# Patient Record
Sex: Female | Born: 1970 | Race: Black or African American | Hispanic: No | Marital: Single | Smoking: Current every day smoker
Health system: Southern US, Community
[De-identification: ages and names within clinical notes are randomized; demographics above are authoritative.]

## PROBLEM LIST (undated history)

## (undated) ENCOUNTER — Encounter

## (undated) ENCOUNTER — Ambulatory Visit: Payer: PRIVATE HEALTH INSURANCE | Attending: Registered" | Primary: Registered"

## (undated) ENCOUNTER — Ambulatory Visit: Payer: PRIVATE HEALTH INSURANCE

## (undated) ENCOUNTER — Ambulatory Visit

## (undated) ENCOUNTER — Encounter: Attending: Internal Medicine | Primary: Internal Medicine

## (undated) ENCOUNTER — Encounter: Attending: Dermatology | Primary: Dermatology

## (undated) ENCOUNTER — Ambulatory Visit: Payer: PRIVATE HEALTH INSURANCE | Attending: Internal Medicine | Primary: Internal Medicine

## (undated) ENCOUNTER — Telehealth

## (undated) ENCOUNTER — Encounter: Attending: "Endocrinology | Primary: "Endocrinology

## (undated) ENCOUNTER — Encounter: Attending: Registered" | Primary: Registered"

## (undated) ENCOUNTER — Ambulatory Visit
Payer: PRIVATE HEALTH INSURANCE | Attending: Rehabilitative and Restorative Service Providers" | Primary: Rehabilitative and Restorative Service Providers"

## (undated) ENCOUNTER — Telehealth: Attending: Internal Medicine | Primary: Internal Medicine

## (undated) ENCOUNTER — Telehealth: Attending: Family | Primary: Family

## (undated) ENCOUNTER — Ambulatory Visit
Payer: PRIVATE HEALTH INSURANCE | Attending: Student in an Organized Health Care Education/Training Program | Primary: Student in an Organized Health Care Education/Training Program

## (undated) ENCOUNTER — Encounter
Attending: Pharmacist Clinician (PhC)/ Clinical Pharmacy Specialist | Primary: Pharmacist Clinician (PhC)/ Clinical Pharmacy Specialist

## (undated) ENCOUNTER — Telehealth
Payer: PRIVATE HEALTH INSURANCE | Attending: Pharmacist Clinician (PhC)/ Clinical Pharmacy Specialist | Primary: Pharmacist Clinician (PhC)/ Clinical Pharmacy Specialist

## (undated) ENCOUNTER — Telehealth
Attending: Pharmacist Clinician (PhC)/ Clinical Pharmacy Specialist | Primary: Pharmacist Clinician (PhC)/ Clinical Pharmacy Specialist

## (undated) ENCOUNTER — Ambulatory Visit: Payer: PRIVATE HEALTH INSURANCE | Attending: Neurology | Primary: Neurology

## (undated) ENCOUNTER — Encounter: Attending: Gynecologic Oncology | Primary: Gynecologic Oncology

## (undated) ENCOUNTER — Ambulatory Visit: Payer: PRIVATE HEALTH INSURANCE | Attending: Family | Primary: Family

## (undated) ENCOUNTER — Ambulatory Visit: Payer: PRIVATE HEALTH INSURANCE | Attending: Gynecologic Oncology | Primary: Gynecologic Oncology

## (undated) ENCOUNTER — Encounter
Attending: Student in an Organized Health Care Education/Training Program | Primary: Student in an Organized Health Care Education/Training Program

## (undated) ENCOUNTER — Encounter: Attending: MOHS-Micrographic Surgery | Primary: MOHS-Micrographic Surgery

## (undated) ENCOUNTER — Telehealth: Attending: Gynecologic Oncology | Primary: Gynecologic Oncology

## (undated) ENCOUNTER — Ambulatory Visit: Attending: Obstetrics & Gynecology | Primary: Obstetrics & Gynecology

## (undated) ENCOUNTER — Encounter: Attending: Obesity Medicine | Primary: Obesity Medicine

## (undated) ENCOUNTER — Telehealth: Attending: "Endocrinology | Primary: "Endocrinology

## (undated) ENCOUNTER — Ambulatory Visit: Payer: PRIVATE HEALTH INSURANCE | Attending: Obesity Medicine | Primary: Obesity Medicine

## (undated) ENCOUNTER — Ambulatory Visit: Payer: PRIVATE HEALTH INSURANCE | Attending: "Endocrinology | Primary: "Endocrinology

## (undated) ENCOUNTER — Telehealth
Attending: Rehabilitative and Restorative Service Providers" | Primary: Rehabilitative and Restorative Service Providers"

## (undated) ENCOUNTER — Ambulatory Visit: Payer: PRIVATE HEALTH INSURANCE | Attending: "Women's Health Care | Primary: "Women's Health Care

## (undated) ENCOUNTER — Ambulatory Visit
Payer: PRIVATE HEALTH INSURANCE | Attending: Pharmacist Clinician (PhC)/ Clinical Pharmacy Specialist | Primary: Pharmacist Clinician (PhC)/ Clinical Pharmacy Specialist

## (undated) ENCOUNTER — Encounter: Attending: Obstetrics & Gynecology | Primary: Obstetrics & Gynecology

## (undated) ENCOUNTER — Telehealth: Attending: Obstetrics & Gynecology | Primary: Obstetrics & Gynecology

## (undated) ENCOUNTER — Ambulatory Visit: Attending: Pharmacist | Primary: Pharmacist

## (undated) DIAGNOSIS — E119 Type 2 diabetes mellitus without complications: Secondary | ICD-10-CM

## (undated) DIAGNOSIS — E7800 Pure hypercholesterolemia, unspecified: Secondary | ICD-10-CM

## (undated) DIAGNOSIS — I10 Essential (primary) hypertension: Secondary | ICD-10-CM

## (undated) HISTORY — PX: ABDOMINAL HYSTERECTOMY: SHX81

## (undated) HISTORY — PX: LIPOMA RESECTION: SHX23

---
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
## 2002-05-04 ENCOUNTER — Ambulatory Visit (HOSPITAL_COMMUNITY): Admission: RE | Admit: 2002-05-04 | Discharge: 2002-05-04 | Payer: Self-pay | Admitting: Obstetrics & Gynecology

## 2002-05-04 ENCOUNTER — Encounter: Payer: Self-pay | Admitting: Obstetrics & Gynecology

## 2002-05-05 ENCOUNTER — Other Ambulatory Visit: Admission: RE | Admit: 2002-05-05 | Discharge: 2002-05-05 | Payer: Self-pay | Admitting: Obstetrics & Gynecology

## 2005-06-18 ENCOUNTER — Emergency Department: Payer: Self-pay | Admitting: Emergency Medicine

## 2005-06-26 ENCOUNTER — Emergency Department: Payer: Self-pay | Admitting: Emergency Medicine

## 2013-10-28 ENCOUNTER — Inpatient Hospital Stay: Payer: Self-pay | Admitting: Internal Medicine

## 2013-10-28 LAB — COMPREHENSIVE METABOLIC PANEL
Albumin: 3.7 g/dL (ref 3.4–5.0)
Alkaline Phosphatase: 115 U/L
Anion Gap: 7 (ref 7–16)
BUN: 13 mg/dL (ref 7–18)
Bilirubin,Total: 0.3 mg/dL (ref 0.2–1.0)
Calcium, Total: 9.2 mg/dL (ref 8.5–10.1)
Chloride: 95 mmol/L — ABNORMAL LOW (ref 98–107)
Co2: 27 mmol/L (ref 21–32)
Creatinine: 1.07 mg/dL (ref 0.60–1.30)
EGFR (African American): >60
EGFR (Non-African Amer.): >60
Glucose: 375 mg/dL — ABNORMAL HIGH (ref 65–99)
Osmolality: 274 (ref 275–301)
Potassium: 3.6 mmol/L (ref 3.5–5.1)
SGOT(AST): 22 U/L (ref 15–37)
SGPT (ALT): 32 U/L (ref 12–78)
Sodium: 129 mmol/L — ABNORMAL LOW (ref 136–145)
Total Protein: 8.7 g/dL — ABNORMAL HIGH (ref 6.4–8.2)

## 2013-10-28 LAB — URINALYSIS, COMPLETE
Bacteria: NONE SEEN
Bilirubin,UR: NEGATIVE
Glucose,UR: >=500 mg/dL (ref 0–75)
Hyaline Cast: 42
Ketone: NEGATIVE
Leukocyte Esterase: NEGATIVE
Nitrite: NEGATIVE
Ph: 5 (ref 4.5–8.0)
Protein: 30
RBC,UR: 1 /HPF (ref 0–5)
Specific Gravity: 1.017 (ref 1.003–1.030)
Squamous Epithelial: 5
WBC UR: 4 /HPF (ref 0–5)

## 2013-10-28 LAB — CBC WITH DIFFERENTIAL/PLATELET
Basophil #: 0.1 10*3/uL (ref 0.0–0.1)
Basophil %: 1.4 %
Eosinophil #: 0.4 10*3/uL (ref 0.0–0.7)
Eosinophil %: 4.6 %
HCT: 48.9 % — ABNORMAL HIGH (ref 35.0–47.0)
HGB: 16.8 g/dL — ABNORMAL HIGH (ref 12.0–16.0)
Lymphocyte #: 2.7 10*3/uL (ref 1.0–3.6)
Lymphocyte %: 30.8 %
MCH: 29 pg (ref 26.0–34.0)
MCHC: 34.4 g/dL (ref 32.0–36.0)
MCV: 84 fL (ref 80–100)
Monocyte #: 0.7 x10 3/mm (ref 0.2–0.9)
Monocyte %: 8.2 %
Neutrophil #: 4.8 10*3/uL (ref 1.4–6.5)
Neutrophil %: 55 %
Platelet: 260 10*3/uL (ref 150–440)
RBC: 5.8 10*6/uL — ABNORMAL HIGH (ref 3.80–5.20)
RDW: 13.2 % (ref 11.5–14.5)
WBC: 8.7 10*3/uL (ref 3.6–11.0)

## 2013-10-28 LAB — LIPASE, BLOOD: Lipase: 616 U/L — ABNORMAL HIGH (ref 73–393)

## 2013-10-29 LAB — BASIC METABOLIC PANEL
Anion Gap: 8 (ref 7–16)
BUN: 11 mg/dL (ref 7–18)
Calcium, Total: 8 mg/dL — ABNORMAL LOW (ref 8.5–10.1)
Chloride: 100 mmol/L (ref 98–107)
Co2: 26 mmol/L (ref 21–32)
Creatinine: 0.94 mg/dL (ref 0.60–1.30)
EGFR (African American): >60
EGFR (Non-African Amer.): >60
Glucose: 276 mg/dL — ABNORMAL HIGH (ref 65–99)
Osmolality: 278 (ref 275–301)
Potassium: 3.7 mmol/L (ref 3.5–5.1)
Sodium: 134 mmol/L — ABNORMAL LOW (ref 136–145)

## 2013-10-29 LAB — LIPID PANEL
Cholesterol: 109 mg/dL (ref 0–200)
HDL Cholesterol: 29 mg/dL — ABNORMAL LOW (ref 40–60)
Ldl Cholesterol, Calc: 47 mg/dL (ref 0–100)
Triglycerides: 167 mg/dL (ref 0–200)
VLDL Cholesterol, Calc: 33 mg/dL (ref 5–40)

## 2013-10-29 LAB — LIPASE, BLOOD: Lipase: 382 U/L (ref 73–393)

## 2013-10-29 LAB — HEMOGLOBIN A1C: Hemoglobin A1C: 11.9 % — ABNORMAL HIGH (ref 4.2–6.3)

## 2015-02-24 NOTE — Discharge Summary (Signed)
PATIENT NAME:  Sherri Escobar, Sherri Escobar MR#:  [id number] DATE OF BIRTH:  1971/05/08  DATE OF ADMISSION:  10/28/2013 DATE OF DISCHARGE:  10/29/2013  PRIMARY CARE PHYSICIAN: Beverely RisenFozia Khan, MD  DISCHARGE DIAGNOSES: 1.  Acute pancreatitis. 2.  Hypertension. 3.  Diabetes. 4.  Morbid obesity. 5.  Tobacco abuse. 6.  Fatty liver.  CONDITION: Stable.   CODE STATUS: FULL.  HOME MEDICATIONS: Please refer to the [facility] physician discharge instruction medication reconciliation list. Continue home medications.   DIET: Low fat, low cholesterol, low sodium, ADA diet.  ACTIVITY: As tolerated.   FOLLOW-UP CARE: Follow with PCP within 1 to 2 weeks.   REASON FOR ADMISSION: Abdominal pain.   HOSPITAL COURSE:  1.  The patient is a 44 year old morbidly obese African American female with history of hypertension and diabetes who presented to the ED with epigastric pain and right upper quadrant pain radiating to the back associated with nausea. Workup in the ED with ultrasound did not show any cholelithiasis or cholecystitis. The patient's lipase was 600. The patient was admitted for acute pancreatitis. For detailed history and physical examination, please refer to the admission note dictated by Dr. Heron NayVasireddy. After admission the patient was kept Escobar.p.o. with IV fluid support and Zofran p.r.Escobar. The patient's symptoms have much improved, only has mild abdominal pain. No nausea or vomiting. The patient tolerated diet today. Lipase decreased to normal range at 382.  2.  Diabetes. The patient's diabetes is not controlled. Hemoglobin A1c is 11.9. The patient is on sliding scale. The last blood sugar was 276. The patient needs to adjust diabetes medication and follow up with PCP for sugar control.  3.  Hypertension. Has been controlled with hypertension medication.   The patient has no complaints. Vital signs are stable. She is clinically stable and will be discharged to home today. In addition, the patient has tobacco abuse.  The patient was counseled for smoking cessation. I discussed the patient's discharge plan with the patient, case manager, nurse, and the patient's mother.   TIME SPENT: About 35 minutes.  ____________________________ Shaune PollackQing Elen Acero, MD qc:sb D: 10/29/2013 12:37:56 ET T: 10/29/2013 13:09:09 ET JOB#: [id number]  cc: Shaune PollackQing Charnice Zwilling, MD, <Dictator> Shaune PollackQING Romie Tay MD ELECTRONICALLY SIGNED 10/29/2013 13:30

## 2015-02-25 NOTE — H&P (Signed)
PATIENT NAME:  [name], [name] MR#:  [id number] DATE OF BIRTH:  Aug 18, 1971  DATE OF ADMISSION:  10/28/2013  PRIMARY CARE PHYSICIAN:  Dr. Beverely RisenFozia Khan.   CHIEF COMPLAINT:  Abdominal pain.   HISTORY OF PRESENT ILLNESS:  Sherri Escobar is a 44 year old morbidly obese female with a history of hypertension, diabetes mellitus, presented to the Emergency Department with complaints of abdominal pain started about two days back.  The pain is in the epigastric, right upper quadrant and radiating to the back.  This is associated with nausea, some subjective fevers.  Concerning this, went to the primary care physician who referred the patient to the Emergency Department, concerning about possible cholecystitis.  Work-up in the Emergency Department, right upper quadrant ultrasound does not indicate cholelithiasis or cholecystitis.  The patient has elevated lipase of 600.  The patient denies drinking any alcohol.  The patient denies any previous episodes of pancreatitis.  The patient is on Onglyza for diabetes control.  Denies having any diarrhea.   PAST MEDICAL HISTORY: 1.  Hypertension.  2.  Diabetes mellitus, on oral medication.  3.  Morbid obesity.  4.  Hyperlipidemia.   PAST SURGICAL HISTORY:  Lipoma removed from the back.   SOCIAL HISTORY:  Continues to smoke 1/2 pack a day.  Drinks alcohol occasionally, last drink was beginning of the month.  Denies using any illicit drugs.  Lives with her mom and sister and brother.  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   FAMILY HISTORY:  Diabetes mellitus and hypertension.   REVIEW OF SYSTEMS: CONSTITUTIONAL:  Denies any generalized weakness, weight loss.  EYES:  No change in vision.  EARS, NOSE, THROAT:  No change in hearing.  GASTROINTESTINAL:  Abdominal pain, nausea, vomiting.  GENITOURINARY:  No dysuria or hematuria.  ENDOCRINE:  No polyuria or polydipsia.  Carries a diagnosis of diabetes mellitus.  SKIN:  No rash or lesions.   MUSCULOSKELETAL:  Good range of motion.  No joint pains and aches.  NEUROLOGIC:  No weakness or numbness in any part of the body.   PHYSICAL EXAMINATION: GENERAL:  This is a well-built, well-nourished, age-appropriate female lying down in the bed, not in distress.  VITAL SIGNS:  Temperature 98, pulse 96, blood pressure 91/60, respiratory rate of 18, oxygen saturation is 100% on room air.  HEENT:  Head normocephalic, atraumatic.  Eyes, no scleral icterus.  Conjunctivae normal.  Pupils equal and react to light.  Extraocular movements are intact.  Mucous membranes moist.  No pharyngeal erythema.  NECK:  Supple.  No lymphadenopathy.  No JVD.  No carotid bruit.  No thyromegaly.  CHEST:  Has no focal tenderness.  LUNGS:  Bilaterally clear to auscultation.  HEART:  S1 and S2 regular.  No murmurs are heard.  ABDOMEN:  Obese.  Bowel sounds plus.  Soft.  Has tenderness in the epigastric and right upper quadrant area.  No guarding or rebound tenderness.  EXTREMITIES:  No pedal edema.  Pulses 2+.  SKIN:  No rash or lesions.  MUSCULOSKELETAL:  Good range of motion in all the extremities.  NEUROLOGIC:  The patient is alert, oriented to place, person and time.  Cranial nerves II through XII intact.  Motor 5 by 5 in upper and lower extremities.   LABORATORY DATA:  CT abdomen and pelvis, gallbladder unremarkable in appearance.  Concurrent right upper quadrant ultrasound, no stones are seen in the gallbladder, diffuse fatty infiltration of the liver with underlying hepatomegaly.  Lipase 616.  Ultrasound of the  right upper quadrant, echogenic liver consistent with fatty infiltration, negative for gallstones.  CMP:  Glucose 375, BUN 13, creatinine of 1.07, sodium 129.  The rest of all the values are within normal limits.  CBC is completely within normal limits.  UA negative for nitrites and leukocyte esterase.   ASSESSMENT AND PLAN:  Sherri Escobar is a 44 year old female who comes to the Emergency Department with  epigastric and right upper quadrant pain, is found to have a pancreatitis.  1.  Acute pancreatitis:  The cause, we will obtain lipid profile, concerning about the patient's fatty infiltration and uncontrolled blood sugars.  The other possibility also is from the Onglyza.  Admit the patient to the medical bed.  Continue with IV fluids.  Follow up with the lipase.  Pain controlled as needed.  2.  Hypertension:  Currently low-normal.  We will hold the lisinopril for now.  Continue the IV fluids and follow up.  3.  Diabetes mellitus.  We will hold the metformin and Onglyza.  Continue the glimepiride. 4.  Keep the patient on deep vein thrombosis prophylaxis with Lovenox.   TIME SPENT:  45 minutes.    ____________________________ Susa Griffins, MD pv:ea D: 10/29/2013 01:02:55 ET T: 10/29/2013 02:22:12 ET JOB#: [id number]  cc: Susa Griffins, MD, <Dictator> Lyndon Code, MD Clerance Lav Mateusz Neilan MD ELECTRONICALLY SIGNED 11/12/2013 21:15

## 2015-05-25 ENCOUNTER — Encounter: Payer: Self-pay | Admitting: *Deleted

## 2015-05-25 ENCOUNTER — Emergency Department
Admission: EM | Admit: 2015-05-25 | Discharge: 2015-05-25 | Disposition: A | Discharge status: Home / Self Care | Payer: 59 | Attending: Emergency Medicine | Admitting: Emergency Medicine

## 2015-05-25 DIAGNOSIS — J329 Chronic sinusitis, unspecified: Secondary | ICD-10-CM | POA: Diagnosis not present

## 2015-05-25 DIAGNOSIS — H9202 Otalgia, left ear: Secondary | ICD-10-CM

## 2015-05-25 DIAGNOSIS — H6992 Unspecified Eustachian tube disorder, left ear: Secondary | ICD-10-CM | POA: Diagnosis not present

## 2015-05-25 DIAGNOSIS — H6980 Other specified disorders of Eustachian tube, unspecified ear: Secondary | ICD-10-CM

## 2015-05-25 DIAGNOSIS — Z720 Tobacco use: Secondary | ICD-10-CM | POA: Insufficient documentation

## 2015-05-25 DIAGNOSIS — J029 Acute pharyngitis, unspecified: Secondary | ICD-10-CM | POA: Diagnosis present

## 2015-05-25 MED ORDER — IBUPROFEN 800 MG PO TABS: 800.0000 mg | ORAL_TABLET | Freq: Three times a day (TID) | ORAL | Status: DC | PRN

## 2015-05-25 MED ORDER — AMOXICILLIN 500 MG PO TABS: 500.0000 mg | ORAL_TABLET | Freq: Three times a day (TID) | ORAL | Status: DC

## 2015-05-25 MED ORDER — IBUPROFEN 800 MG PO TABS
800.0000 mg | ORAL_TABLET | Freq: Once | ORAL | Status: AC
  Administered 2015-05-25: 800 mg via ORAL
  Filled 2015-05-25: qty 1

## 2015-05-25 MED ORDER — AMOXICILLIN 500 MG PO CAPS
500.0000 mg | ORAL_CAPSULE | Freq: Once | ORAL | Status: AC
  Administered 2015-05-25: 500 mg via ORAL
  Filled 2015-05-25: qty 1

## 2015-05-25 NOTE — ED Notes (Signed)
Pt reports sore throat and left earache.  Pt also has sinus drainage.

## 2015-05-25 NOTE — Discharge Instructions (Signed)
Sinusitis °Sinusitis is redness, soreness, and inflammation of the paranasal sinuses. Paranasal sinuses are air pockets within the bones of your face (beneath the eyes, the middle of the forehead, or above the eyes). In healthy paranasal sinuses, mucus is able to drain out, and air is able to circulate through them by way of your nose. However, when your paranasal sinuses are inflamed, mucus and air can become trapped. This can allow bacteria and other germs to grow and cause infection. °Sinusitis can develop quickly and last only a short time (acute) or continue over a long period (chronic). Sinusitis that lasts for more than 12 weeks is considered chronic.  °CAUSES  °Causes of sinusitis include: °· Allergies. °· Structural abnormalities, such as displacement of the cartilage that separates your nostrils (deviated septum), which can decrease the air flow through your nose and sinuses and affect sinus drainage. °· Functional abnormalities, such as when the small hairs (cilia) that line your sinuses and help remove mucus do not work properly or are not present. °SIGNS AND SYMPTOMS  °Symptoms of acute and chronic sinusitis are the same. The primary symptoms are pain and pressure around the affected sinuses. Other symptoms include: °· Upper toothache. °· Earache. °· Headache. °· Bad breath. °· Decreased sense of smell and taste. °· A cough, which worsens when you are lying flat. °· Fatigue. °· Fever. °· Thick drainage from your nose, which often is green and may contain pus (purulent). °· Swelling and warmth over the affected sinuses. °DIAGNOSIS  °Your health care provider will perform a physical exam. During the exam, your health care provider may: °· Look in your nose for signs of abnormal growths in your nostrils (nasal polyps). °· Tap over the affected sinus to check for signs of infection. °· View the inside of your sinuses (endoscopy) using an imaging device that has a light attached (endoscope). °If your health  care provider suspects that you have chronic sinusitis, one or more of the following tests may be recommended: °· Allergy tests. °· Nasal culture. A sample of mucus is taken from your nose, sent to a lab, and screened for bacteria. °· Nasal cytology. A sample of mucus is taken from your nose and examined by your health care provider to determine if your sinusitis is related to an allergy. °TREATMENT  °Most cases of acute sinusitis are related to a viral infection and will resolve on their own within 10 days. Sometimes medicines are prescribed to help relieve symptoms (pain medicine, decongestants, nasal steroid sprays, or saline sprays).  °However, for sinusitis related to a bacterial infection, your health care provider will prescribe antibiotic medicines. These are medicines that will help kill the bacteria causing the infection.  °Rarely, sinusitis is caused by a fungal infection. In theses cases, your health care provider will prescribe antifungal medicine. °For some cases of chronic sinusitis, surgery is needed. Generally, these are cases in which sinusitis recurs more than 3 times per year, despite other treatments. °HOME CARE INSTRUCTIONS  °· Drink plenty of water. Water helps thin the mucus so your sinuses can drain more easily. °· Use a humidifier. °· Inhale steam 3 to 4 times a day (for example, sit in the bathroom with the shower running). °· Apply a warm, moist washcloth to your face 3 to 4 times a day, or as directed by your health care provider. °· Use saline nasal sprays to help moisten and clean your sinuses. °· Take medicines only as directed by your health care provider. °·   If you were prescribed either an antibiotic or antifungal medicine, finish it all even if you start to feel better. SEEK IMMEDIATE MEDICAL CARE IF:  You have increasing pain or severe headaches.  You have nausea, vomiting, or drowsiness.  You have swelling around your face.  You have vision problems.  You have a stiff  neck.  You have difficulty breathing. MAKE SURE YOU:   Understand these instructions.  Will watch your condition.  Will get help right away if you are not doing well or get worse. Document Released: 10/21/2005 Document Revised: 03/07/2014 Document Reviewed: 11/05/2011 [facility] Patient Information 2015 [place], [place]. This information is not intended to replace advice given to you by your health care provider. Make sure you discuss any questions you have with your health care provider.  Otalgia Otalgia is pain in or around the ear. When the pain is from the ear itself it is called primary otalgia. Pain may also be coming from somewhere else, like the head and neck. This is called secondary otalgia.  CAUSES  Causes of primary otalgia include:  Middle ear infection.  It can also be caused by injury to the ear or infection of the ear canal (swimmer's ear). Swimmer's ear causes pain, swelling and often drainage from the ear canal. Causes of secondary otalgia include:  Sinus infections.  Allergies and colds that cause stuffiness of the nose and tubes that drain the ears (eustachian tubes).  Dental problems like cavities, gum infections or teething.  Sore Throat (tonsillitis and pharyngitis).  Swollen glands in the neck.  Infection of the bone behind the ear (mastoiditis).  TMJ discomfort (problems with the joint between your jaw and your skull).  Other problems such as nerve disorders, circulation problems, heart disease and tumors of the head and neck can also cause symptoms of ear pain. This is rare. DIAGNOSIS  Evaluation, Diagnosis and Testing:  Examination by your medical caregiver is recommended to evaluate and diagnose the cause of otalgia.  Further testing or referral to a specialist may be indicated if the cause of the ear pain is not found and the symptom persists. TREATMENT   Your doctor may prescribe antibiotics if an ear infection is diagnosed.  Pain relievers  and topical analgesics may be recommended.  It is important to take all medications as prescribed. HOME CARE INSTRUCTIONS   It may be helpful to sleep with the painful ear in the up position.  A warm compress over the painful ear may provide relief.  A soft diet and avoiding gum may help while ear pain is present. SEEK IMMEDIATE MEDICAL CARE IF:  You develop severe pain, a high fever, repeated vomiting or dehydration.  You develop extreme dizziness, headache, confusion, ringing in the ears (tinnitus) or hearing loss. Document Released: 11/28/2004 Document Revised: 01/13/2012 Document Reviewed: 08/30/2009 [facility] Patient Information 2015 [place], [place]. This information is not intended to replace advice given to you by your health care provider. Make sure you discuss any questions you have with your health care provider.   Take antibiotics as directed and follow up with your physician if not improving.  Return to the ER for any worsening symptoms.  Take ibuprofen as needed for pain.

## 2015-05-25 NOTE — ED Notes (Signed)
Sore throat and left ear pain.

## 2015-05-25 NOTE — ED Notes (Signed)

## 2015-05-25 NOTE — ED Provider Notes (Signed)
[facility] Emergency Department Provider Note  ____________________________________________  Time seen: Approximately 11:17 PM  I have reviewed the triage vital signs and the nursing notes.   HISTORY  Chief Complaint Sore Throat and Otalgia    HPI Analisse N Chittenden is a 44 y.o. female who presents with 4-5 days of worsening left ear pain and sore throat. She also has sinus drainage. She had similar symptoms approximately 2-3 weeks ago that improved without treatment. No current fevers or chills. No cough. No nausea. No chest pain.   No past medical history on file.  There are no active problems to display for this patient.   No past surgical history on file.  Current Outpatient Rx  Name  Route  Sig  Dispense  Refill  . amoxicillin (AMOXIL) 500 MG tablet   Oral   Take 1 tablet (500 mg total) by mouth 3 (three) times daily.   30 tablet   0   . ibuprofen (ADVIL,MOTRIN) 800 MG tablet   Oral   Take 1 tablet (800 mg total) by mouth every 8 (eight) hours as needed.   15 tablet   0     Allergies Review of patient's allergies indicates no known allergies.  No family history on file.  Social History History  Substance Use Topics  . Smoking status: Current Every Day Smoker  . Smokeless tobacco: Not on file  . Alcohol Use: No    Review of Systems Constitutional: No fever/chills Eyes: No visual changes. ENT:  sore throat and ear pain.. Cardiovascular: Denies chest pain. Respiratory: Denies shortness of breath. Gastrointestinal: No abdominal pain.  No nausea, no vomiting.  No diarrhea.  No constipation. Genitourinary: Negative for dysuria. Musculoskeletal: Negative for back pain. Skin: Negative for rash. Neurological: Negative for headaches, focal weakness or numbness.   10-point ROS otherwise negative.  ____________________________________________   PHYSICAL EXAM:  VITAL SIGNS: ED Triage Vitals  Enc Vitals Group     BP  05/25/15 2219 114/88 mmHg     Pulse Rate 05/25/15 2219 100     Resp 05/25/15 2219 18     Temp 05/25/15 2219 97.9 F (36.6 C)     Temp Source 05/25/15 2219 Oral     SpO2 05/25/15 2219 100 %     Weight 05/25/15 2219 329 lb (149.233 kg)     Height 05/25/15 2219 5\' 8"  (1.727 m)     Head Cir --      Peak Flow --      Pain Score 05/25/15 2224 9     Pain Loc --      Pain Edu? --      Excl. in GC? --      Constitutional: Alert and oriented. Well appearing and in no acute distress. Eyes: Conjunctivae are normal. PERRL. EOMI. Head: Atraumatic. Nose: No congestion/rhinnorhea. Mouth/Throat: Mucous membranes are moist.  Oropharynx moderate-erythematous. Ear: Clear with normal landmarks, except yellow discoloration to left TM. No erythema. Neck: supple.   Hematological/Lymphatic/Immunilogical: No cervical lymphadenopathy. Cardiovascular: Normal rate, regular rhythm. Grossly normal heart sounds.  Good peripheral circulation. Respiratory: Normal respiratory effort.  No retractions. Lungs CTAB. Gastrointestinal: Soft and nontender. No distention. No abdominal bruits. No CVA tenderness. Skin:  Skin is warm, dry and intact. No rash noted. Psychiatric: Mood and affect are normal. Speech and behavior are normal.  ____________________________________________   LABS (all labs ordered are listed, but only abnormal results are displayed)  Labs Reviewed - No data to display ____________________________________________  EKG  ____________________________________________  RADIOLOGY    ____________________________________________   PROCEDURES  Procedure(s) performed: None  Critical Care performed: No  ____________________________________________   INITIAL IMPRESSION / ASSESSMENT AND PLAN / ED COURSE  Pertinent labs & imaging results that were available during my care of the patient were reviewed by me and considered in my medical decision making (see chart for  details).  44 year old female with recurrent sinus drainage, sore throat and ear pain. Yellow drainage on exam of unclear significance. She has used eardrops for pain relief. Cover  for otitis media, and sinusitis with amoxicillin. She is also given ibuprofen for pain relief. ____________________________________________   FINAL CLINICAL IMPRESSION(S) / ED DIAGNOSES  Final diagnoses:  Sinusitis, unspecified chronicity, unspecified location  ETD (eustachian tube dysfunction), unspecified laterality  Otalgia, left      Ignacia Bayley, PA-C 05/25/15 2322  Loleta Rose, MD 05/25/15 2326

## 2016-11-04 ENCOUNTER — Encounter: Payer: Self-pay | Admitting: Emergency Medicine

## 2016-11-04 ENCOUNTER — Emergency Department: Payer: 59

## 2016-11-04 DIAGNOSIS — E119 Type 2 diabetes mellitus without complications: Secondary | ICD-10-CM | POA: Diagnosis not present

## 2016-11-04 DIAGNOSIS — F17210 Nicotine dependence, cigarettes, uncomplicated: Secondary | ICD-10-CM | POA: Diagnosis not present

## 2016-11-04 DIAGNOSIS — Z79899 Other long term (current) drug therapy: Secondary | ICD-10-CM | POA: Insufficient documentation

## 2016-11-04 DIAGNOSIS — I10 Essential (primary) hypertension: Secondary | ICD-10-CM | POA: Diagnosis not present

## 2016-11-04 DIAGNOSIS — J09X2 Influenza due to identified novel influenza A virus with other respiratory manifestations: Secondary | ICD-10-CM | POA: Insufficient documentation

## 2016-11-04 DIAGNOSIS — R0602 Shortness of breath: Secondary | ICD-10-CM | POA: Diagnosis present

## 2016-11-04 LAB — BASIC METABOLIC PANEL
Anion gap: 9 (ref 5–15)
BUN: 6 mg/dL (ref 6–20)
CO2: 24 mmol/L (ref 22–32)
Calcium: 8.7 mg/dL — ABNORMAL LOW (ref 8.9–10.3)
Chloride: 99 mmol/L — ABNORMAL LOW (ref 101–111)
Creatinine, Ser: 0.66 mg/dL (ref 0.44–1.00)
GFR calc Af Amer: >60 mL/min (ref >60)
GFR calc non Af Amer: >60 mL/min (ref >60)
GLUCOSE: 231 mg/dL — AB (ref 65–99)
Potassium: 3.6 mmol/L (ref 3.5–5.1)
Sodium: 132 mmol/L — ABNORMAL LOW (ref 135–145)

## 2016-11-04 LAB — CBC
HCT: 42.1 % (ref 35.0–47.0)
HEMOGLOBIN: 14.9 g/dL (ref 12.0–16.0)
MCH: 29.2 pg (ref 26.0–34.0)
MCHC: 35.3 g/dL (ref 32.0–36.0)
MCV: 82.6 fL (ref 80.0–100.0)
Platelets: 172 10*3/uL (ref 150–440)
RBC: 5.1 MIL/uL (ref 3.80–5.20)
RDW: 13.7 % (ref 11.5–14.5)
WBC: 9.1 10*3/uL (ref 3.6–11.0)

## 2016-11-04 LAB — TROPONIN I: Troponin I: <0.03 ng/mL (ref <0.03)

## 2016-11-04 NOTE — ED Triage Notes (Addendum)
Pt ambulatory to triage with slow steady gait with c/o shortness of breath since yesterday accompanied by chest pain, n/v, and generalized body aches. Pt speaking in complete sentences, respirations even and labored upon exertion. Pt reports cough, states today had a temperature of 102. Denies taking any OTC medicine. Pt alert and oriented x 4, skin warm and dry. Bilateral clear lung sounds.

## 2016-11-05 ENCOUNTER — Emergency Department
Admission: EM | Admit: 2016-11-05 | Discharge: 2016-11-05 | Disposition: A | Discharge status: Home / Self Care | Payer: 59 | Attending: Emergency Medicine | Admitting: Emergency Medicine

## 2016-11-05 DIAGNOSIS — J101 Influenza due to other identified influenza virus with other respiratory manifestations: Secondary | ICD-10-CM

## 2016-11-05 HISTORY — DX: Essential (primary) hypertension: I10

## 2016-11-05 HISTORY — DX: Type 2 diabetes mellitus without complications: E11.9

## 2016-11-05 HISTORY — DX: Pure hypercholesterolemia, unspecified: E78.00

## 2016-11-05 LAB — URINALYSIS, ROUTINE W REFLEX MICROSCOPIC
BILIRUBIN URINE: NEGATIVE
Glucose, UA: >=500 mg/dL — AB
Hgb urine dipstick: NEGATIVE
Ketones, ur: 20 mg/dL — AB
LEUKOCYTES UA: NEGATIVE
Nitrite: NEGATIVE
Protein, ur: 30 mg/dL — AB
Specific Gravity, Urine: 1.013 (ref 1.005–1.030)
pH: 6 (ref 5.0–8.0)

## 2016-11-05 LAB — INFLUENZA PANEL BY PCR (TYPE A & B)
INFLBPCR: NEGATIVE
Influenza A By PCR: POSITIVE — AB

## 2016-11-05 MED ORDER — SODIUM CHLORIDE 0.9 % IV BOLUS (SEPSIS)
1000.0000 mL | INTRAVENOUS | Status: AC
  Administered 2016-11-05: 1000 mL via INTRAVENOUS

## 2016-11-05 MED ORDER — OSELTAMIVIR PHOSPHATE 75 MG PO CAPS: 75.0000 mg | ORAL_CAPSULE | Freq: Two times a day (BID) | ORAL | 0 refills | Status: AC

## 2016-11-05 MED ORDER — IBUPROFEN 600 MG PO TABS
600.0000 mg | ORAL_TABLET | Freq: Once | ORAL | Status: AC
  Administered 2016-11-05: 600 mg via ORAL

## 2016-11-05 MED ORDER — LISINOPRIL 20 MG PO TABS
20.0000 mg | ORAL_TABLET | Freq: Once | ORAL | Status: AC
  Administered 2016-11-05: 20 mg via ORAL
  Filled 2016-11-05: qty 1

## 2016-11-05 MED ORDER — PROMETHAZINE HCL 25 MG PO TABS
25.0000 mg | ORAL_TABLET | Freq: Once | ORAL | Status: AC
  Administered 2016-11-05: 25 mg via ORAL
  Filled 2016-11-05: qty 1

## 2016-11-05 MED ORDER — ACETAMINOPHEN 500 MG PO TABS
1000.0000 mg | ORAL_TABLET | Freq: Once | ORAL | Status: AC
  Administered 2016-11-05: 1000 mg via ORAL

## 2016-11-05 MED ORDER — HYDROCHLOROTHIAZIDE 25 MG PO TABS
25.0000 mg | ORAL_TABLET | Freq: Once | ORAL | Status: AC
  Administered 2016-11-05: 25 mg via ORAL
  Filled 2016-11-05: qty 1

## 2016-11-05 MED ORDER — ACETAMINOPHEN 500 MG PO TABS
ORAL_TABLET | ORAL | Status: AC
  Administered 2016-11-05: 1000 mg via ORAL
  Filled 2016-11-05: qty 2

## 2016-11-05 MED ORDER — METOPROLOL TARTRATE 25 MG PO TABS
25.0000 mg | ORAL_TABLET | Freq: Once | ORAL | Status: AC
  Administered 2016-11-05: 25 mg via ORAL
  Filled 2016-11-05: qty 1

## 2016-11-05 MED ORDER — IBUPROFEN 600 MG PO TABS
ORAL_TABLET | ORAL | Status: AC
  Filled 2016-11-05: qty 1

## 2016-11-05 NOTE — ED Notes (Signed)
Informed MD York CeriseForbach of Patient's temperature

## 2016-11-05 NOTE — ED Provider Notes (Signed)
[facility] Emergency Department Provider Note  ____________________________________________   First MD Initiated Contact with Patient 11/05/16 0110     (approximate)  I have reviewed the triage vital signs and the nursing notes.   HISTORY  Chief Complaint Shortness of Breath    HPI Sherri Escobar is a 46 y.o. female who is morbidly obese and a chronic cigarette smokerwho presents with 2-3 days of gradually worsening upper respiratory infection symptoms that include nasal congestion, runny nose, general malaise, body aches, fever, chills, cough, nausea/vomiting, headache.  She reports that the symptoms are moderate to severe.  She has some chest discomfort when she coughs she describes as tightness.  Nothing her symptoms better nor worse.  She has a family member who is with her in the emergency department he was ill with the same symptoms that lasted a few weeks and she is just getting over them.  The patient does not believe in flu vaccinations and has not had one this year.  She is able to eat and drink in spite of having 1 or 2 episodes of emesis but states that she know she needs to drink more water especially when she is sick.  She has not had any shortness of breath.   Past Medical History:  Diagnosis Date  . Diabetes mellitus without complication (HCC)   . High cholesterol   . Hypertension     There are no active problems to display for this patient.   Past Surgical History:  Procedure Laterality Date  . LIPOMA RESECTION      Prior to Admission medications   Medication Sig Start Date End Date Taking? Authorizing Provider  lisinopril-hydrochlorothiazide (PRINZIDE,ZESTORETIC) 20-25 MG tablet Take 1 tablet by mouth 2 (two) times daily.   Yes Historical Provider, MD  METOPROLOL TARTRATE PO Take by mouth 2 (two) times daily.   Yes Historical Provider, MD  amoxicillin (AMOXIL) 500 MG tablet Take 1 tablet (500 mg total) by mouth 3 (three) times  daily. 05/25/15   Ignacia Bayleyobert Tumey, PA-C  ibuprofen (ADVIL,MOTRIN) 800 MG tablet Take 1 tablet (800 mg total) by mouth every 8 (eight) hours as needed. 05/25/15   Ignacia Bayleyobert Tumey, PA-C  oseltamivir (TAMIFLU) 75 MG capsule Take 1 capsule (75 mg total) by mouth 2 (two) times daily. 11/05/16 11/15/16  Loleta Roseory Lilton Pare, MD    Allergies Cashew nut oil and Zofran [ondansetron hcl]  No family history on file.  Social History Social History  Substance Use Topics  . Smoking status: Current Every Day Smoker    Packs/day: 0.50  . Smokeless tobacco: Never Used  . Alcohol use No    Review of Systems Constitutional: +fever/chills, +myalgias, +fatigue Eyes: No visual changes. ENT: No sore throat. Cardiovascular: chest pain with cough Respiratory: Denies shortness of breath but with frequent cough Gastrointestinal: No abdominal pain.  +N/V.  No diarrhea.  No constipation. Genitourinary: Negative for dysuria. Musculoskeletal: Negative for back pain. Skin: Negative for rash. Neurological: Headache.  10-point ROS otherwise negative.  ____________________________________________   PHYSICAL EXAM:  VITAL SIGNS: ED Triage Vitals  Enc Vitals Group     BP 11/04/16 2203 (!) 165/84     Pulse Rate 11/04/16 2203 (!) 138     Resp 11/04/16 2203 (!) 22     Temp 11/04/16 2203 99.8 F (37.7 C)     Temp Source 11/04/16 2203 Oral     SpO2 11/04/16 2203 95 %     Weight 11/04/16 2203 (!) 340 lb (154.2 kg)  Height 11/04/16 2203 5\' 8"  (1.727 m)     Head Circumference --      Peak Flow --      Pain Score 11/04/16 2207 3     Pain Loc --      Pain Edu? --      Excl. in GC? --     Constitutional: Alert and oriented. Well appearing and in no acute distress. Eyes: Conjunctivae are normal. PERRL. EOMI. Head: Atraumatic. Nose: No congestion/rhinnorhea. Mouth/Throat: Mucous membranes are moist.  Oropharynx non-erythematous. Neck: No stridor.  No meningeal signs.   Cardiovascular: Normal rate, regular rhythm. Good  peripheral circulation. Grossly normal heart sounds. Respiratory: Normal respiratory effort.  No retractions. Lungs CTAB. Gastrointestinal: Morbid obesity. Soft and nontender. No distention.  Musculoskeletal: No lower extremity tenderness nor edema. No gross deformities of extremities. Neurologic:  Normal speech and language. No gross focal neurologic deficits are appreciated.  Skin:  Skin is warm, dry and intact. No rash noted. Psychiatric: Mood and affect are normal. Speech and behavior are normal.  ____________________________________________   LABS (all labs ordered are listed, but only abnormal results are displayed)  Labs Reviewed  BASIC METABOLIC PANEL - Abnormal; Notable for the following:       Result Value   Sodium 132 (*)    Chloride 99 (*)    Glucose, Bld 231 (*)    Calcium 8.7 (*)    All other components within normal limits  URINALYSIS, ROUTINE W REFLEX MICROSCOPIC - Abnormal; Notable for the following:    Color, Urine YELLOW (*)    APPearance CLEAR (*)    Glucose, UA >=500 (*)    Ketones, ur 20 (*)    Protein, ur 30 (*)    Bacteria, UA RARE (*)    Squamous Epithelial / LPF 0-5 (*)    All other components within normal limits  INFLUENZA PANEL BY PCR (TYPE A & B, H1N1) - Abnormal; Notable for the following:    Influenza A By PCR POSITIVE (*)    All other components within normal limits  CBC  TROPONIN I   ____________________________________________  EKG  ED ECG REPORT I, Gizzelle Lacomb, the attending physician, personally viewed and interpreted this ECG.  Date: 11/04/2016 EKG Time: 22:14 Rate: 129 Rhythm: sinus tachycardia QRS Axis: normal Intervals: normal ST/T Wave abnormalities: normal Conduction Disturbances: none Narrative Interpretation: unremarkable  ____________________________________________  RADIOLOGY   Dg Chest 2 View  Result Date: 11/04/2016 CLINICAL DATA:  Chest pain, dyspnea, cough and fever. EXAM: CHEST  2 VIEW COMPARISON:  None.  FINDINGS: Heart size is top-normal. No aortic aneurysm. Mild diffuse interstitial prominence noted bilaterally which may reflect bronchitic change. No alveolar consolidation, effusion or pneumothorax. The left lateral costophrenic angle is excluded. IMPRESSION: Mild interstitial prominence bilaterally suspicious for bronchitic change. Electronically Signed   By: Tollie Eth M.D.   On: 11/04/2016 22:39    ____________________________________________   PROCEDURES  Procedure(s) performed:   Procedures   Critical Care performed: No ____________________________________________   INITIAL IMPRESSION / ASSESSMENT AND PLAN / ED COURSE  Pertinent labs & imaging results that were available during my care of the patient were reviewed by me and considered in my medical decision making (see chart for details).  The patient looks better than her vital signs would indicate.  She is hypertensive because she has not taken her blood pressure medicine today.  She is febrile, tachycardic, and slightly to, but I believe that the latter 2 are likely a result of her fever  and her morbid obesity.  Her labs are reassuring as is her chest x-ray which displays only a bronchitic viral pattern and is likely exacerbated by her chronic smoking.  In spite of her abnormal vital signs and obvious flulike symptoms, she is joking with me, alert, oriented, and appropriate.  I will provide fluid resuscitation for her tachycardia and see if her tachycardia improves, but even if it does not completely resolve of believe she will be appropriate for outpatient follow-up given her obvious influenza-like symptoms.  She does have a regular doctor that she can call tomorrow.   Clinical Course as of Nov 05 325  Tue Nov 05, 2016  0308 Flu positive.  I discussed the results with the patient and giving her a second liter of fluid since her tachycardia did improve somewhat after the first liter.  She continues to be in no acute distress in  spite of her diagnosis.  We had my usual and customary influenza discussion including return precautions.  I will discharge her after the second liter fluids to follow-up as an outpatient. Influenza A By PCR: (!) POSITIVE [CF]    Clinical Course User Index [CF] Loleta Rose, MD    ____________________________________________  FINAL CLINICAL IMPRESSION(S) / ED DIAGNOSES  Final diagnoses:  Influenza A     MEDICATIONS GIVEN DURING THIS VISIT:  Medications  acetaminophen (TYLENOL) tablet 1,000 mg (1,000 mg Oral Given 11/05/16 0028)  sodium chloride 0.9 % bolus 1,000 mL (0 mLs Intravenous Stopped 11/05/16 0240)  ibuprofen (ADVIL,MOTRIN) tablet 600 mg (600 mg Oral Given 11/05/16 0121)  promethazine (PHENERGAN) tablet 25 mg (25 mg Oral Given 11/05/16 0212)  metoprolol tartrate (LOPRESSOR) tablet 25 mg (25 mg Oral Given 11/05/16 0212)  lisinopril (PRINIVIL,ZESTRIL) tablet 20 mg (20 mg Oral Given 11/05/16 4098)  hydrochlorothiazide (HYDRODIURIL) tablet 25 mg (25 mg Oral Given 11/05/16 1191)  sodium chloride 0.9 % bolus 1,000 mL (1,000 mLs Intravenous New Bag/Given 11/05/16 0241)     NEW OUTPATIENT MEDICATIONS STARTED DURING THIS VISIT:  New Prescriptions   OSELTAMIVIR (TAMIFLU) 75 MG CAPSULE    Take 1 capsule (75 mg total) by mouth 2 (two) times daily.    Modified Medications   No medications on file    Discontinued Medications   No medications on file     Note:  This document was prepared using Dragon voice recognition software and may include unintentional dictation errors.    Loleta Rose, MD 11/05/16 [phone]

## 2016-11-05 NOTE — ED Notes (Signed)
Reviewed d/c instructions, follow-up care, prescription and use of OTC antipyretics with pt. Pt verbalized understanding

## 2016-11-05 NOTE — ED Notes (Signed)
Patient c/o chills, SOB, body aches, productive cough, nasal drainage, congestion, N/V, headache, chest pain described as tightness with cough.

## 2016-11-05 NOTE — Discharge Instructions (Signed)

## 2016-11-05 NOTE — ED Notes (Signed)
Informed MD York CeriseForbach of patient's temperature

## 2017-05-13 ENCOUNTER — Ambulatory Visit
Admission: RE | Admit: 2017-05-13 | Discharge: 2017-05-13 | Disposition: A | Discharge status: Home / Self Care | Payer: Commercial Managed Care - PPO | Attending: Internal Medicine

## 2017-05-13 DIAGNOSIS — E1142 Type 2 diabetes mellitus with diabetic polyneuropathy: Secondary | ICD-10-CM

## 2017-05-13 DIAGNOSIS — I10 Essential (primary) hypertension: Secondary | ICD-10-CM

## 2017-05-13 DIAGNOSIS — Z794 Long term (current) use of insulin: Secondary | ICD-10-CM

## 2017-05-13 DIAGNOSIS — F419 Anxiety disorder, unspecified: Secondary | ICD-10-CM

## 2017-05-13 DIAGNOSIS — Z6841 Body Mass Index (BMI) 40.0 and over, adult: Secondary | ICD-10-CM

## 2017-05-13 DIAGNOSIS — K859 Acute pancreatitis without necrosis or infection, unspecified: Principal | ICD-10-CM

## 2017-05-13 DIAGNOSIS — F17200 Nicotine dependence, unspecified, uncomplicated: Secondary | ICD-10-CM

## 2017-05-13 MED ORDER — SERTRALINE 100 MG TABLET
ORAL_TABLET | Freq: Every day | ORAL | 12 refills | 0.00000 days | Status: CP
Start: 2017-05-13 — End: 2018-06-02

## 2017-06-05 ENCOUNTER — Ambulatory Visit
Admission: RE | Admit: 2017-06-05 | Discharge: 2017-06-05 | Disposition: A | Discharge status: Home / Self Care | Payer: Commercial Managed Care - PPO | Attending: Internal Medicine

## 2017-06-05 DIAGNOSIS — R300 Dysuria: Principal | ICD-10-CM

## 2017-06-05 DIAGNOSIS — N926 Irregular menstruation, unspecified: Secondary | ICD-10-CM

## 2017-07-03 ENCOUNTER — Ambulatory Visit
Admission: RE | Admit: 2017-07-03 | Discharge: 2017-07-03 | Disposition: A | Discharge status: Home / Self Care | Payer: Commercial Managed Care - PPO

## 2017-07-03 DIAGNOSIS — N926 Irregular menstruation, unspecified: Principal | ICD-10-CM

## 2017-07-14 ENCOUNTER — Ambulatory Visit
Admission: RE | Admit: 2017-07-14 | Discharge: 2017-07-14 | Payer: Commercial Managed Care - PPO | Attending: Obstetrics & Gynecology | Admitting: Obstetrics & Gynecology

## 2017-07-14 DIAGNOSIS — N926 Irregular menstruation, unspecified: Principal | ICD-10-CM

## 2017-07-14 MED ORDER — TRIAMCINOLONE ACETONIDE 0.1 % TOPICAL CREAM
0 refills | 0 days | Status: CP
Start: 2017-07-14 — End: 2017-09-17

## 2017-08-11 ENCOUNTER — Ambulatory Visit: Admission: RE | Admit: 2017-08-11 | Discharge: 2017-08-11 | Payer: Commercial Managed Care - PPO

## 2017-08-11 ENCOUNTER — Ambulatory Visit
Admission: RE | Admit: 2017-08-11 | Discharge: 2017-08-11 | Payer: Commercial Managed Care - PPO | Admitting: Obstetrics & Gynecology

## 2017-08-11 DIAGNOSIS — N939 Abnormal uterine and vaginal bleeding, unspecified: Secondary | ICD-10-CM

## 2017-08-11 DIAGNOSIS — Z01818 Encounter for other preprocedural examination: Principal | ICD-10-CM

## 2017-08-13 DIAGNOSIS — N939 Abnormal uterine and vaginal bleeding, unspecified: Principal | ICD-10-CM

## 2017-08-14 ENCOUNTER — Ambulatory Visit: Admission: RE | Admit: 2017-08-14 | Discharge: 2017-08-14 | Disposition: A | Discharge status: Home / Self Care

## 2017-08-14 ENCOUNTER — Ambulatory Visit
Admission: RE | Admit: 2017-08-14 | Discharge: 2017-08-14 | Disposition: A | Discharge status: Home / Self Care | Payer: Commercial Managed Care - PPO | Attending: Obstetrics & Gynecology

## 2017-08-14 DIAGNOSIS — N939 Abnormal uterine and vaginal bleeding, unspecified: Principal | ICD-10-CM

## 2017-08-14 MED ORDER — ACETAMINOPHEN ER 650 MG TABLET,EXTENDED RELEASE
ORAL_TABLET | Freq: Four times a day (QID) | ORAL | 0 refills | 0 days | Status: CP
Start: 2017-08-14 — End: 2018-01-08

## 2017-08-20 MED ORDER — JENTADUETO 2.5 MG-1,000 MG TABLET
ORAL_TABLET | 12 refills | 0 days | Status: CP
Start: 2017-08-20 — End: 2018-09-04

## 2017-08-29 ENCOUNTER — Ambulatory Visit
Admission: RE | Admit: 2017-08-29 | Discharge: 2017-08-29 | Payer: Commercial Managed Care - PPO | Attending: Obstetrics & Gynecology

## 2017-08-29 DIAGNOSIS — C55 Malignant neoplasm of uterus, part unspecified: Principal | ICD-10-CM

## 2017-09-02 ENCOUNTER — Ambulatory Visit
Admission: RE | Admit: 2017-09-02 | Discharge: 2017-09-02 | Disposition: A | Discharge status: Home / Self Care | Payer: Commercial Managed Care - PPO | Attending: Gynecologic Oncology | Admitting: Gynecologic Oncology

## 2017-09-02 ENCOUNTER — Ambulatory Visit
Admission: RE | Admit: 2017-09-02 | Discharge: 2017-09-02 | Disposition: A | Discharge status: Home / Self Care | Payer: Commercial Managed Care - PPO | Attending: Internal Medicine | Admitting: Internal Medicine

## 2017-09-02 DIAGNOSIS — E1142 Type 2 diabetes mellitus with diabetic polyneuropathy: Secondary | ICD-10-CM

## 2017-09-02 DIAGNOSIS — Z6841 Body Mass Index (BMI) 40.0 and over, adult: Secondary | ICD-10-CM

## 2017-09-02 DIAGNOSIS — Z794 Long term (current) use of insulin: Secondary | ICD-10-CM

## 2017-09-02 DIAGNOSIS — I10 Essential (primary) hypertension: Secondary | ICD-10-CM

## 2017-09-02 DIAGNOSIS — C541 Malignant neoplasm of endometrium: Secondary | ICD-10-CM

## 2017-09-02 DIAGNOSIS — M65312 Trigger thumb, left thumb: Secondary | ICD-10-CM

## 2017-09-02 DIAGNOSIS — C55 Malignant neoplasm of uterus, part unspecified: Secondary | ICD-10-CM

## 2017-09-02 DIAGNOSIS — R21 Rash and other nonspecific skin eruption: Secondary | ICD-10-CM

## 2017-09-02 MED ORDER — EMPAGLIFLOZIN 25 MG TABLET
ORAL_TABLET | Freq: Every day | ORAL | 3 refills | 0.00000 days | Status: CP
Start: 2017-09-02 — End: 2018-09-28

## 2017-09-02 MED ORDER — GABAPENTIN 300 MG CAPSULE
ORAL_CAPSULE | Freq: Three times a day (TID) | ORAL | 3 refills | 0.00000 days | Status: CP
Start: 2017-09-02 — End: 2018-09-04

## 2017-09-09 DIAGNOSIS — C541 Malignant neoplasm of endometrium: Principal | ICD-10-CM

## 2017-09-10 ENCOUNTER — Ambulatory Visit
Admission: RE | Admit: 2017-09-10 | Discharge: 2017-09-10 | Disposition: A | Discharge status: Home / Self Care | Payer: Commercial Managed Care - PPO | Attending: Anesthesiology | Admitting: Anesthesiology

## 2017-09-10 ENCOUNTER — Ambulatory Visit
Admission: RE | Admit: 2017-09-10 | Discharge: 2017-09-10 | Disposition: A | Discharge status: Home / Self Care | Payer: Commercial Managed Care - PPO | Attending: Gynecologic Oncology | Admitting: Gynecologic Oncology

## 2017-09-10 ENCOUNTER — Ambulatory Visit
Admission: RE | Admit: 2017-09-10 | Discharge: 2017-09-10 | Disposition: A | Discharge status: Home / Self Care | Payer: Commercial Managed Care - PPO

## 2017-09-10 DIAGNOSIS — C541 Malignant neoplasm of endometrium: Principal | ICD-10-CM

## 2017-09-10 MED ORDER — IBUPROFEN 800 MG TABLET: 800 mg | tablet | Freq: Three times a day (TID) | 0 refills | 0 days | Status: AC

## 2017-09-10 MED ORDER — DOCUSATE SODIUM 100 MG TABLET: 100 mg | tablet | Freq: Two times a day (BID) | 0 refills | 0 days | Status: AC

## 2017-09-10 MED ORDER — IBUPROFEN 800 MG TABLET
ORAL_TABLET | Freq: Three times a day (TID) | ORAL | 0 refills | 0.00000 days | Status: CP | PRN
Start: 2017-09-10 — End: 2017-09-10

## 2017-09-10 MED ORDER — DOCUSATE SODIUM 100 MG TABLET
ORAL_TABLET | Freq: Two times a day (BID) | ORAL | 0 refills | 0.00000 days | Status: CP | PRN
Start: 2017-09-10 — End: 2017-09-17

## 2017-09-11 MED ORDER — LISINOPRIL 20 MG-HYDROCHLOROTHIAZIDE 12.5 MG TABLET
ORAL_TABLET | 3 refills | 0 days | Status: CP
Start: 2017-09-11 — End: 2018-09-28

## 2017-09-11 MED ORDER — PEN NEEDLE, DIABETIC 32 GAUGE X 5/32" (4 MM)
Freq: Every day | 3 refills | 0.00000 days | Status: CP
Start: 2017-09-11 — End: 2018-10-05

## 2017-09-11 MED ORDER — METOPROLOL TARTRATE 50 MG TABLET
ORAL_TABLET | 3 refills | 0 days | Status: CP
Start: 2017-09-11 — End: 2018-09-28

## 2017-09-17 ENCOUNTER — Ambulatory Visit: Admission: RE | Admit: 2017-09-17 | Discharge: 2017-09-17 | Payer: Commercial Managed Care - PPO

## 2017-09-17 DIAGNOSIS — R21 Rash and other nonspecific skin eruption: Principal | ICD-10-CM

## 2017-09-17 DIAGNOSIS — L719 Rosacea, unspecified: Secondary | ICD-10-CM

## 2017-09-17 MED ORDER — TRIAMCINOLONE ACETONIDE 0.1 % TOPICAL CREAM
3 refills | 0 days | Status: CP
Start: 2017-09-17 — End: 2018-10-02

## 2017-09-17 MED ORDER — METRONIDAZOLE 0.75 % TOPICAL CREAM
Freq: Every morning | TOPICAL | 5 refills | 0 days | Status: CP
Start: 2017-09-17 — End: 2018-09-17

## 2017-09-19 ENCOUNTER — Ambulatory Visit
Admission: RE | Admit: 2017-09-19 | Discharge: 2017-09-19 | Payer: Commercial Managed Care - PPO | Attending: Orthopaedic Surgery | Admitting: Orthopaedic Surgery

## 2017-09-19 DIAGNOSIS — M65312 Trigger thumb, left thumb: Principal | ICD-10-CM

## 2017-09-19 MED ORDER — LEVEMIR FLEXTOUCH U-100 INSULIN 100 UNIT/ML (3 ML) SUBCUTANEOUS PEN
5 refills | 0 days | Status: CP
Start: 2017-09-19 — End: 2017-11-07

## 2017-10-01 ENCOUNTER — Ambulatory Visit
Admission: RE | Admit: 2017-10-01 | Discharge: 2017-10-01 | Payer: Commercial Managed Care - PPO | Attending: Registered" | Admitting: Registered"

## 2017-10-01 ENCOUNTER — Ambulatory Visit: Admission: RE | Admit: 2017-10-01 | Discharge: 2017-10-01 | Attending: Family

## 2017-10-01 DIAGNOSIS — Z794 Long term (current) use of insulin: Secondary | ICD-10-CM

## 2017-10-01 DIAGNOSIS — K760 Fatty (change of) liver, not elsewhere classified: Secondary | ICD-10-CM

## 2017-10-01 DIAGNOSIS — E7800 Pure hypercholesterolemia, unspecified: Secondary | ICD-10-CM

## 2017-10-01 DIAGNOSIS — Z8542 Personal history of malignant neoplasm of other parts of uterus: Secondary | ICD-10-CM

## 2017-10-01 DIAGNOSIS — Z6841 Body Mass Index (BMI) 40.0 and over, adult: Principal | ICD-10-CM

## 2017-10-01 DIAGNOSIS — I10 Essential (primary) hypertension: Secondary | ICD-10-CM

## 2017-10-01 DIAGNOSIS — E119 Type 2 diabetes mellitus without complications: Secondary | ICD-10-CM

## 2017-10-01 DIAGNOSIS — Z7189 Other specified counseling: Secondary | ICD-10-CM

## 2017-10-01 DIAGNOSIS — Z8719 Personal history of other diseases of the digestive system: Secondary | ICD-10-CM

## 2017-10-02 ENCOUNTER — Ambulatory Visit
Admission: RE | Admit: 2017-10-02 | Discharge: 2017-10-02 | Payer: Commercial Managed Care - PPO | Attending: Dermatology | Admitting: Dermatology

## 2017-10-02 DIAGNOSIS — L920 Granuloma annulare: Principal | ICD-10-CM

## 2017-10-20 ENCOUNTER — Ambulatory Visit
Admission: RE | Admit: 2017-10-20 | Discharge: 2017-10-20 | Disposition: A | Discharge status: Home / Self Care | Payer: Commercial Managed Care - PPO

## 2017-10-20 ENCOUNTER — Ambulatory Visit
Admission: RE | Admit: 2017-10-20 | Discharge: 2017-10-20 | Disposition: A | Discharge status: Home / Self Care | Payer: Commercial Managed Care - PPO | Attending: Internal Medicine | Admitting: Internal Medicine

## 2017-10-20 DIAGNOSIS — Z794 Long term (current) use of insulin: Secondary | ICD-10-CM

## 2017-10-20 DIAGNOSIS — C541 Malignant neoplasm of endometrium: Secondary | ICD-10-CM

## 2017-10-20 DIAGNOSIS — Z6841 Body Mass Index (BMI) 40.0 and over, adult: Secondary | ICD-10-CM

## 2017-10-20 DIAGNOSIS — L920 Granuloma annulare: Principal | ICD-10-CM

## 2017-10-20 DIAGNOSIS — I10 Essential (primary) hypertension: Secondary | ICD-10-CM

## 2017-10-20 DIAGNOSIS — E119 Type 2 diabetes mellitus without complications: Principal | ICD-10-CM

## 2017-10-20 DIAGNOSIS — E1142 Type 2 diabetes mellitus with diabetic polyneuropathy: Secondary | ICD-10-CM

## 2017-10-20 DIAGNOSIS — E7800 Pure hypercholesterolemia, unspecified: Secondary | ICD-10-CM

## 2017-10-21 ENCOUNTER — Ambulatory Visit
Admission: RE | Admit: 2017-10-21 | Discharge: 2017-10-21 | Disposition: A | Discharge status: Home / Self Care | Payer: Commercial Managed Care - PPO | Attending: Gynecologic Oncology | Admitting: Gynecologic Oncology

## 2017-10-21 DIAGNOSIS — Z794 Long term (current) use of insulin: Secondary | ICD-10-CM

## 2017-10-21 DIAGNOSIS — C541 Malignant neoplasm of endometrium: Principal | ICD-10-CM

## 2017-10-21 DIAGNOSIS — Z8719 Personal history of other diseases of the digestive system: Secondary | ICD-10-CM

## 2017-10-21 DIAGNOSIS — E1142 Type 2 diabetes mellitus with diabetic polyneuropathy: Secondary | ICD-10-CM

## 2017-10-21 DIAGNOSIS — I10 Essential (primary) hypertension: Secondary | ICD-10-CM

## 2017-10-21 DIAGNOSIS — Z6841 Body Mass Index (BMI) 40.0 and over, adult: Secondary | ICD-10-CM

## 2017-11-07 MED ORDER — BASAGLAR 100 UNIT/ML (3 ML) SUBCUTANEOUS PEN
Freq: Two times a day (BID) | SUBCUTANEOUS | 12 refills | 0.00000 days | Status: CP
Start: 2017-11-07 — End: 2018-01-23

## 2017-11-28 ENCOUNTER — Ambulatory Visit
Admit: 2017-11-28 | Discharge: 2017-11-29 | Payer: PRIVATE HEALTH INSURANCE | Attending: Gynecologic Oncology | Primary: Gynecologic Oncology

## 2017-11-28 DIAGNOSIS — C541 Malignant neoplasm of endometrium: Principal | ICD-10-CM

## 2017-11-28 DIAGNOSIS — I10 Essential (primary) hypertension: Secondary | ICD-10-CM

## 2017-11-28 DIAGNOSIS — Z6841 Body Mass Index (BMI) 40.0 and over, adult: Secondary | ICD-10-CM

## 2017-11-28 DIAGNOSIS — E1142 Type 2 diabetes mellitus with diabetic polyneuropathy: Secondary | ICD-10-CM

## 2017-12-10 ENCOUNTER — Ambulatory Visit: Admit: 2017-12-10 | Discharge: 2017-12-10 | Payer: PRIVATE HEALTH INSURANCE

## 2017-12-10 ENCOUNTER — Ambulatory Visit
Admit: 2017-12-10 | Discharge: 2017-12-10 | Payer: PRIVATE HEALTH INSURANCE | Attending: Obesity Medicine | Primary: Obesity Medicine

## 2017-12-10 ENCOUNTER — Ambulatory Visit
Admit: 2017-12-10 | Discharge: 2017-12-10 | Payer: PRIVATE HEALTH INSURANCE | Attending: Registered" | Primary: Registered"

## 2017-12-10 DIAGNOSIS — Z794 Long term (current) use of insulin: Secondary | ICD-10-CM

## 2017-12-10 DIAGNOSIS — Z7189 Other specified counseling: Secondary | ICD-10-CM

## 2017-12-10 DIAGNOSIS — Z8719 Personal history of other diseases of the digestive system: Secondary | ICD-10-CM

## 2017-12-10 DIAGNOSIS — I10 Essential (primary) hypertension: Secondary | ICD-10-CM

## 2017-12-10 DIAGNOSIS — E119 Type 2 diabetes mellitus without complications: Secondary | ICD-10-CM

## 2017-12-10 DIAGNOSIS — Z713 Dietary counseling and surveillance: Secondary | ICD-10-CM

## 2017-12-10 DIAGNOSIS — E7800 Pure hypercholesterolemia, unspecified: Secondary | ICD-10-CM

## 2017-12-10 DIAGNOSIS — E669 Obesity, unspecified: Principal | ICD-10-CM

## 2017-12-10 DIAGNOSIS — Z6841 Body Mass Index (BMI) 40.0 and over, adult: Secondary | ICD-10-CM

## 2017-12-10 DIAGNOSIS — K760 Fatty (change of) liver, not elsewhere classified: Secondary | ICD-10-CM

## 2017-12-10 DIAGNOSIS — Z8542 Personal history of malignant neoplasm of other parts of uterus: Secondary | ICD-10-CM

## 2017-12-25 ENCOUNTER — Ambulatory Visit: Admit: 2017-12-25 | Discharge: 2017-12-26 | Payer: PRIVATE HEALTH INSURANCE

## 2017-12-25 ENCOUNTER — Ambulatory Visit: Admit: 2017-12-25 | Discharge: 2017-12-25 | Payer: PRIVATE HEALTH INSURANCE

## 2017-12-25 ENCOUNTER — Ambulatory Visit
Admission: RE | Admit: 2017-12-25 | Discharge: 2017-12-25 | Payer: Commercial Managed Care - PPO | Attending: Obesity Medicine | Admitting: Obesity Medicine

## 2017-12-25 DIAGNOSIS — Z6841 Body Mass Index (BMI) 40.0 and over, adult: Secondary | ICD-10-CM

## 2017-12-25 DIAGNOSIS — Z720 Tobacco use: Secondary | ICD-10-CM

## 2017-12-25 DIAGNOSIS — C541 Malignant neoplasm of endometrium: Secondary | ICD-10-CM

## 2017-12-25 DIAGNOSIS — Z01818 Encounter for other preprocedural examination: Principal | ICD-10-CM

## 2017-12-25 DIAGNOSIS — E1142 Type 2 diabetes mellitus with diabetic polyneuropathy: Secondary | ICD-10-CM

## 2017-12-25 DIAGNOSIS — Z794 Long term (current) use of insulin: Secondary | ICD-10-CM

## 2017-12-25 DIAGNOSIS — F418 Other specified anxiety disorders: Secondary | ICD-10-CM

## 2017-12-25 DIAGNOSIS — I10 Essential (primary) hypertension: Secondary | ICD-10-CM

## 2017-12-25 DIAGNOSIS — J4520 Mild intermittent asthma, uncomplicated: Secondary | ICD-10-CM

## 2018-01-06 DIAGNOSIS — C541 Malignant neoplasm of endometrium: Principal | ICD-10-CM

## 2018-01-07 ENCOUNTER — Ambulatory Visit: Admit: 2018-01-07 | Discharge: 2018-01-08 | Payer: PRIVATE HEALTH INSURANCE

## 2018-01-07 ENCOUNTER — Encounter
Admit: 2018-01-07 | Discharge: 2018-01-08 | Payer: PRIVATE HEALTH INSURANCE | Attending: Student in an Organized Health Care Education/Training Program | Primary: Student in an Organized Health Care Education/Training Program

## 2018-01-07 DIAGNOSIS — C541 Malignant neoplasm of endometrium: Principal | ICD-10-CM

## 2018-01-08 MED ORDER — DOCUSATE SODIUM 100 MG CAPSULE
ORAL_CAPSULE | Freq: Two times a day (BID) | ORAL | 0 refills | 0 days | Status: CP | PRN
Start: 2018-01-08 — End: 2018-02-07

## 2018-01-08 MED ORDER — IBUPROFEN 600 MG TABLET
ORAL_TABLET | Freq: Four times a day (QID) | ORAL | 1 refills | 0 days | Status: CP | PRN
Start: 2018-01-08 — End: 2018-10-05

## 2018-01-08 MED ORDER — OXYCODONE 5 MG TABLET
ORAL_TABLET | ORAL | 0 refills | 0.00000 days | Status: CP | PRN
Start: 2018-01-08 — End: 2018-01-13

## 2018-01-08 MED ORDER — ACETAMINOPHEN 325 MG TABLET
ORAL_TABLET | Freq: Four times a day (QID) | ORAL | 1 refills | 0 days | Status: CP | PRN
Start: 2018-01-08 — End: ?

## 2018-01-14 ENCOUNTER — Other Ambulatory Visit: Admit: 2018-01-14 | Discharge: 2018-01-15 | Payer: PRIVATE HEALTH INSURANCE

## 2018-01-14 DIAGNOSIS — C549 Malignant neoplasm of corpus uteri, unspecified: Principal | ICD-10-CM

## 2018-01-23 ENCOUNTER — Ambulatory Visit
Admit: 2018-01-23 | Discharge: 2018-01-23 | Payer: PRIVATE HEALTH INSURANCE | Attending: Student in an Organized Health Care Education/Training Program | Primary: Student in an Organized Health Care Education/Training Program

## 2018-01-23 ENCOUNTER — Ambulatory Visit
Admit: 2018-01-23 | Discharge: 2018-01-23 | Payer: PRIVATE HEALTH INSURANCE | Attending: Internal Medicine | Primary: Internal Medicine

## 2018-01-23 DIAGNOSIS — H26493 Other secondary cataract, bilateral: Secondary | ICD-10-CM

## 2018-01-23 DIAGNOSIS — C541 Malignant neoplasm of endometrium: Secondary | ICD-10-CM

## 2018-01-23 DIAGNOSIS — H43393 Other vitreous opacities, bilateral: Secondary | ICD-10-CM

## 2018-01-23 DIAGNOSIS — H43392 Other vitreous opacities, left eye: Principal | ICD-10-CM

## 2018-01-23 DIAGNOSIS — I10 Essential (primary) hypertension: Secondary | ICD-10-CM

## 2018-01-23 DIAGNOSIS — Z794 Long term (current) use of insulin: Secondary | ICD-10-CM

## 2018-01-23 DIAGNOSIS — Z6841 Body Mass Index (BMI) 40.0 and over, adult: Secondary | ICD-10-CM

## 2018-01-23 DIAGNOSIS — E113393 Type 2 diabetes mellitus with moderate nonproliferative diabetic retinopathy without macular edema, bilateral: Secondary | ICD-10-CM

## 2018-01-23 DIAGNOSIS — E1142 Type 2 diabetes mellitus with diabetic polyneuropathy: Secondary | ICD-10-CM

## 2018-01-23 DIAGNOSIS — B373 Candidiasis of vulva and vagina: Secondary | ICD-10-CM

## 2018-01-23 DIAGNOSIS — H3562 Retinal hemorrhage, left eye: Secondary | ICD-10-CM

## 2018-01-23 MED ORDER — FLUCONAZOLE 150 MG TABLET
ORAL_TABLET | Freq: Once | ORAL | 0 refills | 0 days | Status: CP
Start: 2018-01-23 — End: 2018-01-23

## 2018-01-23 MED ORDER — BASAGLAR 100 UNIT/ML (3 ML) SUBCUTANEOUS PEN
Freq: Every day | SUBCUTANEOUS | 12 refills | 0.00000 days
Start: 2018-01-23 — End: 2018-10-22

## 2018-01-26 ENCOUNTER — Ambulatory Visit
Admit: 2018-01-26 | Discharge: 2018-01-26 | Payer: PRIVATE HEALTH INSURANCE | Attending: Gynecologic Oncology | Primary: Gynecologic Oncology

## 2018-01-26 DIAGNOSIS — C541 Malignant neoplasm of endometrium: Principal | ICD-10-CM

## 2018-03-06 ENCOUNTER — Ambulatory Visit: Admit: 2018-03-06 | Discharge: 2018-03-06 | Payer: PRIVATE HEALTH INSURANCE

## 2018-03-06 DIAGNOSIS — Z1231 Encounter for screening mammogram for malignant neoplasm of breast: Principal | ICD-10-CM

## 2018-03-12 MED ORDER — FLUCONAZOLE 150 MG TABLET
ORAL_TABLET | Freq: Once | ORAL | 0 refills | 0.00000 days | Status: CP
Start: 2018-03-12 — End: 2018-03-12

## 2018-03-25 ENCOUNTER — Ambulatory Visit: Admit: 2018-03-25 | Discharge: 2018-03-25 | Payer: PRIVATE HEALTH INSURANCE

## 2018-03-25 ENCOUNTER — Ambulatory Visit
Admit: 2018-03-25 | Discharge: 2018-03-25 | Payer: PRIVATE HEALTH INSURANCE | Attending: Optometrist | Primary: Optometrist

## 2018-03-25 DIAGNOSIS — H5213 Myopia, bilateral: Principal | ICD-10-CM

## 2018-03-25 DIAGNOSIS — H524 Presbyopia: Secondary | ICD-10-CM

## 2018-03-27 ENCOUNTER — Ambulatory Visit: Admit: 2018-03-27 | Discharge: 2018-03-27 | Payer: PRIVATE HEALTH INSURANCE

## 2018-03-27 ENCOUNTER — Ambulatory Visit
Admit: 2018-03-27 | Discharge: 2018-03-27 | Payer: PRIVATE HEALTH INSURANCE | Attending: Internal Medicine | Primary: Internal Medicine

## 2018-03-27 DIAGNOSIS — H43393 Other vitreous opacities, bilateral: Secondary | ICD-10-CM

## 2018-03-27 DIAGNOSIS — Z794 Long term (current) use of insulin: Secondary | ICD-10-CM

## 2018-03-27 DIAGNOSIS — Z6841 Body Mass Index (BMI) 40.0 and over, adult: Secondary | ICD-10-CM

## 2018-03-27 DIAGNOSIS — E113393 Type 2 diabetes mellitus with moderate nonproliferative diabetic retinopathy without macular edema, bilateral: Principal | ICD-10-CM

## 2018-03-27 DIAGNOSIS — H43399 Other vitreous opacities, unspecified eye: Secondary | ICD-10-CM

## 2018-03-27 DIAGNOSIS — H3562 Retinal hemorrhage, left eye: Secondary | ICD-10-CM

## 2018-03-27 DIAGNOSIS — I10 Essential (primary) hypertension: Secondary | ICD-10-CM

## 2018-03-27 DIAGNOSIS — Z9079 Acquired absence of other genital organ(s): Secondary | ICD-10-CM

## 2018-03-27 DIAGNOSIS — Z90722 Acquired absence of ovaries, bilateral: Secondary | ICD-10-CM

## 2018-03-27 DIAGNOSIS — E1142 Type 2 diabetes mellitus with diabetic polyneuropathy: Principal | ICD-10-CM

## 2018-03-27 DIAGNOSIS — Z90710 Acquired absence of both cervix and uterus: Secondary | ICD-10-CM

## 2018-03-27 DIAGNOSIS — B373 Candidiasis of vulva and vagina: Secondary | ICD-10-CM

## 2018-03-27 DIAGNOSIS — F329 Major depressive disorder, single episode, unspecified: Secondary | ICD-10-CM

## 2018-04-08 ENCOUNTER — Encounter
Admit: 2018-04-08 | Discharge: 2018-04-08 | Payer: PRIVATE HEALTH INSURANCE | Attending: Anesthesiology | Primary: Anesthesiology

## 2018-04-08 ENCOUNTER — Ambulatory Visit: Admit: 2018-04-08 | Discharge: 2018-04-08 | Payer: PRIVATE HEALTH INSURANCE

## 2018-04-08 DIAGNOSIS — R1013 Epigastric pain: Principal | ICD-10-CM

## 2018-04-10 ENCOUNTER — Ambulatory Visit
Admit: 2018-04-10 | Discharge: 2018-04-11 | Payer: PRIVATE HEALTH INSURANCE | Attending: Optometrist | Primary: Optometrist

## 2018-04-10 DIAGNOSIS — H524 Presbyopia: Secondary | ICD-10-CM

## 2018-04-10 DIAGNOSIS — H5213 Myopia, bilateral: Principal | ICD-10-CM

## 2018-05-05 ENCOUNTER — Ambulatory Visit
Admit: 2018-05-05 | Discharge: 2018-05-06 | Payer: PRIVATE HEALTH INSURANCE | Attending: Gynecologic Oncology | Primary: Gynecologic Oncology

## 2018-05-05 DIAGNOSIS — C541 Malignant neoplasm of endometrium: Principal | ICD-10-CM

## 2018-06-02 MED ORDER — SERTRALINE 100 MG TABLET
ORAL_TABLET | 5 refills | 0 days | Status: CP
Start: 2018-06-02 — End: 2018-12-25

## 2018-06-29 ENCOUNTER — Ambulatory Visit
Admit: 2018-06-29 | Discharge: 2018-06-30 | Payer: PRIVATE HEALTH INSURANCE | Attending: Internal Medicine | Primary: Internal Medicine

## 2018-06-29 DIAGNOSIS — Z794 Long term (current) use of insulin: Secondary | ICD-10-CM

## 2018-06-29 DIAGNOSIS — J302 Other seasonal allergic rhinitis: Secondary | ICD-10-CM

## 2018-06-29 DIAGNOSIS — F418 Other specified anxiety disorders: Secondary | ICD-10-CM

## 2018-06-29 DIAGNOSIS — Z6841 Body Mass Index (BMI) 40.0 and over, adult: Secondary | ICD-10-CM

## 2018-06-29 DIAGNOSIS — I10 Essential (primary) hypertension: Secondary | ICD-10-CM

## 2018-06-29 DIAGNOSIS — E1142 Type 2 diabetes mellitus with diabetic polyneuropathy: Principal | ICD-10-CM

## 2018-06-29 DIAGNOSIS — C541 Malignant neoplasm of endometrium: Secondary | ICD-10-CM

## 2018-09-04 MED ORDER — JENTADUETO 2.5 MG-1,000 MG TABLET
ORAL_TABLET | 4 refills | 0 days | Status: CP
Start: 2018-09-04 — End: ?

## 2018-09-04 MED ORDER — GABAPENTIN 300 MG CAPSULE
ORAL_CAPSULE | 4 refills | 0 days | Status: CP
Start: 2018-09-04 — End: ?

## 2018-09-16 ENCOUNTER — Ambulatory Visit: Admit: 2018-09-16 | Discharge: 2018-09-17 | Payer: PRIVATE HEALTH INSURANCE

## 2018-09-16 DIAGNOSIS — L219 Seborrheic dermatitis, unspecified: Secondary | ICD-10-CM

## 2018-09-16 DIAGNOSIS — L920 Granuloma annulare: Principal | ICD-10-CM

## 2018-09-16 DIAGNOSIS — D239 Other benign neoplasm of skin, unspecified: Secondary | ICD-10-CM

## 2018-09-16 MED ORDER — KETOCONAZOLE 2 % TOPICAL CREAM
2 refills | 0 days | Status: CP
Start: 2018-09-16 — End: ?

## 2018-09-16 MED ORDER — KETOCONAZOLE 2 % SHAMPOO
2 refills | 0 days | Status: CP
Start: 2018-09-16 — End: ?

## 2018-09-29 MED ORDER — EMPAGLIFLOZIN 25 MG TABLET
ORAL_TABLET | Freq: Every day | ORAL | 0 refills | 0.00000 days | Status: CP
Start: 2018-09-29 — End: 2018-12-25

## 2018-09-29 MED ORDER — METOPROLOL TARTRATE 50 MG TABLET
ORAL_TABLET | 0 refills | 0 days | Status: CP
Start: 2018-09-29 — End: 2018-12-25

## 2018-09-29 MED ORDER — LISINOPRIL 20 MG-HYDROCHLOROTHIAZIDE 12.5 MG TABLET
ORAL_TABLET | 0 refills | 0 days | Status: CP
Start: 2018-09-29 — End: 2018-12-25

## 2018-10-05 ENCOUNTER — Ambulatory Visit
Admit: 2018-10-05 | Discharge: 2018-10-06 | Payer: PRIVATE HEALTH INSURANCE | Attending: Internal Medicine | Primary: Internal Medicine

## 2018-10-05 DIAGNOSIS — E1142 Type 2 diabetes mellitus with diabetic polyneuropathy: Secondary | ICD-10-CM

## 2018-10-05 DIAGNOSIS — E119 Type 2 diabetes mellitus without complications: Principal | ICD-10-CM

## 2018-10-05 DIAGNOSIS — I10 Essential (primary) hypertension: Secondary | ICD-10-CM

## 2018-10-05 DIAGNOSIS — Z6841 Body Mass Index (BMI) 40.0 and over, adult: Secondary | ICD-10-CM

## 2018-10-05 DIAGNOSIS — M778 Other enthesopathies, not elsewhere classified: Secondary | ICD-10-CM

## 2018-10-05 DIAGNOSIS — H938X3 Other specified disorders of ear, bilateral: Secondary | ICD-10-CM

## 2018-10-05 DIAGNOSIS — R1906 Epigastric swelling, mass or lump: Secondary | ICD-10-CM

## 2018-10-05 DIAGNOSIS — C541 Malignant neoplasm of endometrium: Secondary | ICD-10-CM

## 2018-10-05 DIAGNOSIS — H6063 Unspecified chronic otitis externa, bilateral: Secondary | ICD-10-CM

## 2018-10-05 DIAGNOSIS — Z794 Long term (current) use of insulin: Secondary | ICD-10-CM

## 2018-10-05 DIAGNOSIS — F17200 Nicotine dependence, unspecified, uncomplicated: Secondary | ICD-10-CM

## 2018-10-05 MED ORDER — IBUPROFEN 800 MG TABLET
ORAL_TABLET | Freq: Three times a day (TID) | ORAL | 2 refills | 0 days | Status: CP | PRN
Start: 2018-10-05 — End: 2019-06-18

## 2018-10-05 MED ORDER — DICLOFENAC 1 % TOPICAL GEL
Freq: Four times a day (QID) | TOPICAL | 12 refills | 0 days | Status: CP
Start: 2018-10-05 — End: 2019-10-05

## 2018-10-05 MED ORDER — HYDROCORTISONE-ACETIC ACID 1 %-2 % EAR DROPS
Freq: Two times a day (BID) | OTIC | 0 refills | 0.00000 days | Status: CP
Start: 2018-10-05 — End: ?

## 2018-10-05 MED ORDER — TRIAMCINOLONE ACETONIDE 0.1 % TOPICAL CREAM
INTRAMUSCULAR | 0 refills | 0.00000 days | Status: CP
Start: 2018-10-05 — End: ?

## 2018-10-05 MED ORDER — CYCLOBENZAPRINE 10 MG TABLET
ORAL_TABLET | Freq: Every evening | ORAL | 6 refills | 0.00000 days | Status: CP | PRN
Start: 2018-10-05 — End: ?

## 2018-10-05 MED ORDER — PEN NEEDLE, DIABETIC 32 GAUGE X 5/32" (4 MM)
Freq: Every day | 3 refills | 0 days | Status: CP
Start: 2018-10-05 — End: 2019-05-28

## 2018-10-08 ENCOUNTER — Ambulatory Visit
Admit: 2018-10-08 | Discharge: 2018-10-09 | Payer: PRIVATE HEALTH INSURANCE | Attending: Student in an Organized Health Care Education/Training Program | Primary: Student in an Organized Health Care Education/Training Program

## 2018-10-08 DIAGNOSIS — E113592 Type 2 diabetes mellitus with proliferative diabetic retinopathy without macular edema, left eye: Secondary | ICD-10-CM

## 2018-10-08 DIAGNOSIS — H3562 Retinal hemorrhage, left eye: Principal | ICD-10-CM

## 2018-10-08 DIAGNOSIS — E113391 Type 2 diabetes mellitus with moderate nonproliferative diabetic retinopathy without macular edema, right eye: Secondary | ICD-10-CM

## 2018-10-22 ENCOUNTER — Ambulatory Visit: Admit: 2018-10-22 | Discharge: 2018-10-23 | Payer: PRIVATE HEALTH INSURANCE

## 2018-10-22 DIAGNOSIS — R1906 Epigastric swelling, mass or lump: Principal | ICD-10-CM

## 2018-10-22 DIAGNOSIS — K580 Irritable bowel syndrome with diarrhea: Secondary | ICD-10-CM

## 2018-10-22 MED ORDER — RIFAXIMIN 550 MG TABLET
ORAL_TABLET | Freq: Three times a day (TID) | ORAL | 0 refills | 0 days | Status: CP
Start: 2018-10-22 — End: 2018-11-05

## 2018-10-23 MED ORDER — ACETIC ACID 2 % EAR SOLUTION
Freq: Three times a day (TID) | OTIC | 0 refills | 0.00000 days | Status: CP
Start: 2018-10-23 — End: 2018-10-30

## 2018-11-03 ENCOUNTER — Ambulatory Visit
Admit: 2018-11-03 | Discharge: 2018-11-04 | Payer: PRIVATE HEALTH INSURANCE | Attending: Orthopaedic Surgery | Primary: Orthopaedic Surgery

## 2018-11-03 DIAGNOSIS — M654 Radial styloid tenosynovitis [de Quervain]: Secondary | ICD-10-CM

## 2018-11-03 DIAGNOSIS — M65312 Trigger thumb, left thumb: Principal | ICD-10-CM

## 2018-11-12 MED ORDER — BASAGLAR KWIKPEN U-100 INSULIN 100 UNIT/ML (3 ML) SUBCUTANEOUS
0 refills | 0 days | Status: CP
Start: 2018-11-12 — End: 2019-01-21

## 2018-11-26 ENCOUNTER — Ambulatory Visit: Admit: 2018-11-26 | Discharge: 2018-11-27 | Payer: PRIVATE HEALTH INSURANCE

## 2018-11-26 DIAGNOSIS — R140 Abdominal distension (gaseous): Principal | ICD-10-CM

## 2018-12-25 MED ORDER — JARDIANCE 25 MG TABLET
ORAL_TABLET | 4 refills | 0 days | Status: CP
Start: 2018-12-25 — End: ?

## 2018-12-25 MED ORDER — LISINOPRIL 20 MG-HYDROCHLOROTHIAZIDE 12.5 MG TABLET
ORAL_TABLET | 4 refills | 0 days | Status: CP
Start: 2018-12-25 — End: ?

## 2018-12-25 MED ORDER — SERTRALINE 100 MG TABLET
ORAL_TABLET | 4 refills | 0 days | Status: CP
Start: 2018-12-25 — End: 2019-05-28

## 2018-12-25 MED ORDER — METOPROLOL TARTRATE 50 MG TABLET
ORAL_TABLET | 4 refills | 0 days | Status: CP
Start: 2018-12-25 — End: ?

## 2019-01-04 MED ORDER — FLUCONAZOLE 150 MG TABLET
ORAL_TABLET | Freq: Once | ORAL | 0 refills | 0 days | Status: CP
Start: 2019-01-04 — End: 2019-01-04

## 2019-01-21 MED ORDER — BASAGLAR KWIKPEN U-100 INSULIN 100 UNIT/ML (3 ML) SUBCUTANEOUS
0 refills | 0 days | Status: CP
Start: 2019-01-21 — End: 2019-05-28

## 2019-04-25 ENCOUNTER — Emergency Department: Payer: 59

## 2019-04-25 ENCOUNTER — Encounter: Payer: Self-pay | Admitting: *Deleted

## 2019-04-25 ENCOUNTER — Other Ambulatory Visit: Payer: Self-pay

## 2019-04-25 ENCOUNTER — Emergency Department
Admission: EM | Admit: 2019-04-25 | Discharge: 2019-04-25 | Disposition: A | Discharge status: Home / Self Care | Payer: 59 | Attending: Emergency Medicine | Admitting: Emergency Medicine

## 2019-04-25 DIAGNOSIS — Z79899 Other long term (current) drug therapy: Secondary | ICD-10-CM | POA: Insufficient documentation

## 2019-04-25 DIAGNOSIS — I10 Essential (primary) hypertension: Secondary | ICD-10-CM | POA: Insufficient documentation

## 2019-04-25 DIAGNOSIS — F17200 Nicotine dependence, unspecified, uncomplicated: Secondary | ICD-10-CM | POA: Diagnosis not present

## 2019-04-25 DIAGNOSIS — E119 Type 2 diabetes mellitus without complications: Secondary | ICD-10-CM | POA: Insufficient documentation

## 2019-04-25 DIAGNOSIS — R42 Dizziness and giddiness: Secondary | ICD-10-CM | POA: Diagnosis present

## 2019-04-25 LAB — BASIC METABOLIC PANEL
Anion gap: 11 (ref 5–15)
BUN: 15 mg/dL (ref 6–20)
CO2: 32 mmol/L (ref 22–32)
Calcium: 9.9 mg/dL (ref 8.9–10.3)
Chloride: 101 mmol/L (ref 98–111)
Creatinine, Ser: 0.71 mg/dL (ref 0.44–1.00)
GFR calc Af Amer: >60 mL/min (ref >60)
GFR calc non Af Amer: >60 mL/min (ref >60)
Glucose, Bld: 138 mg/dL — ABNORMAL HIGH (ref 70–99)
Potassium: 3.7 mmol/L (ref 3.5–5.1)
Sodium: 144 mmol/L (ref 135–145)

## 2019-04-25 LAB — CBC
HCT: 50.6 % — ABNORMAL HIGH (ref 36.0–46.0)
Hemoglobin: 16.5 g/dL — ABNORMAL HIGH (ref 12.0–15.0)
MCH: 28.3 pg (ref 26.0–34.0)
MCHC: 32.6 g/dL (ref 30.0–36.0)
MCV: 86.6 fL (ref 80.0–100.0)
Platelets: 227 10*3/uL (ref 150–400)
RBC: 5.84 MIL/uL — ABNORMAL HIGH (ref 3.87–5.11)
RDW: 13.6 % (ref 11.5–15.5)
WBC: 8.7 10*3/uL (ref 4.0–10.5)
nRBC: 0 % (ref 0.0–0.2)

## 2019-04-25 LAB — TROPONIN I: Troponin I: <0.03 ng/mL (ref <0.03)

## 2019-04-25 LAB — ETHANOL: Alcohol, Ethyl (B): <10 mg/dL (ref <10)

## 2019-04-25 MED ORDER — SODIUM CHLORIDE 0.9% FLUSH
3.0000 mL | Freq: Once | INTRAVENOUS | Status: AC
  Administered 2019-04-25: 3 mL via INTRAVENOUS

## 2019-04-25 MED ORDER — MECLIZINE HCL 25 MG PO TABS: 25.0000 mg | ORAL_TABLET | Freq: Three times a day (TID) | ORAL | 0 refills | Status: AC | PRN

## 2019-04-25 MED ORDER — MECLIZINE HCL 25 MG PO TABS
25.0000 mg | ORAL_TABLET | Freq: Once | ORAL | Status: AC
  Administered 2019-04-25: 25 mg via ORAL
  Filled 2019-04-25: qty 1

## 2019-04-25 MED ORDER — SODIUM CHLORIDE 0.9 % IV BOLUS
500.0000 mL | Freq: Once | INTRAVENOUS | Status: AC
  Administered 2019-04-25: 500 mL via INTRAVENOUS

## 2019-04-25 NOTE — Discharge Instructions (Signed)
You have any numbness or weakness difficulty talking or other concerns return to the emergency room otherwise follow closely with your primary care doctor and the neurologist as needed and listed above.

## 2019-04-25 NOTE — ED Notes (Signed)
MD at bedside. 

## 2019-04-25 NOTE — ED Provider Notes (Addendum)
[facility] Emergency Department Provider Note  ____________________________________________   I have reviewed the triage vital signs and the nursing notes. Where available I have reviewed prior notes and, if possible and indicated, outside hospital notes.    HISTORY  Chief Complaint Hypertension and Dizziness    HPI Sherri Escobar is a 48 y.o. female who states she does have a history of "dizziness" going back for years, which is a spinning sensation.  She has had this innumerable times.  She has the same spinning sensation she always has.  She states it started last night and then was gone and then came back again today.  No fall, no focal numbness or weakness no headache no chest pain or shortness of breath it was associated with nausea and vomiting.  Did not have any headache.  She states that she is never had any imaging for this she has seen her doctor for balance issues associated with this in the past however.  Never had an MRI.  Denies any diarrhea dysuria or other associated symptoms.  No head trauma.  A sensation of spinning was worse when she changes position especially this morning, seems to be abating as it normally does.     Past Medical History:  Diagnosis Date  . Diabetes mellitus without complication ([place])   . High cholesterol   . Hypertension     There are no active problems to display for this patient.   Past Surgical History:  Procedure Laterality Date  . LIPOMA RESECTION      Prior to Admission medications   Medication Sig Start Date End Date Taking? Authorizing Provider  amoxicillin (AMOXIL) 500 MG tablet Take 1 tablet (500 mg total) by mouth 3 (three) times daily. 05/25/15   Mortimer Fries, PA-C  ibuprofen (ADVIL,MOTRIN) 800 MG tablet Take 1 tablet (800 mg total) by mouth every 8 (eight) hours as needed. 05/25/15   Mortimer Fries, PA-C  lisinopril-hydrochlorothiazide (PRINZIDE,ZESTORETIC) 20-25 MG tablet Take 1 tablet by mouth 2  (two) times daily.    [provider]  METOPROLOL TARTRATE PO Take by mouth 2 (two) times daily.    [provider]    Allergies Cashew nut oil and Zofran [ondansetron hcl]  History reviewed. No pertinent family history.  Social History Social History   Tobacco Use  . Smoking status: Current Every Day Smoker    Packs/day: 0.50  . Smokeless tobacco: Never Used  Substance Use Topics  . Alcohol use: No  . Drug use: Not on file    Review of Systems Constitutional: No fever/chills Eyes: No visual changes. ENT: No sore throat. No stiff neck no neck pain Cardiovascular: Denies chest pain. Respiratory: Denies shortness of breath. Gastrointestinal:   + vomiting.  No diarrhea.  No constipation. Genitourinary: Negative for dysuria. Musculoskeletal: Negative lower extremity swelling Skin: Negative for rash. Neurological: Negative for severe headaches, focal weakness or numbness.   ____________________________________________   PHYSICAL EXAM:  VITAL SIGNS: ED Triage Vitals  Enc Vitals Group     BP 04/25/19 1611 (!) 144/90     Pulse Rate 04/25/19 1611 87     Resp 04/25/19 1611 16     Temp 04/25/19 1611 98.9 F (37.2 C)     Temp Source 04/25/19 1611 Oral     SpO2 04/25/19 1611 94 %     Weight 04/25/19 1607 (!) 366 lb 9.6 oz (166.3 kg)     Height --      Head Circumference --  Peak Flow --      Pain Score 04/25/19 1607 0     Pain Loc --      Pain Edu? --      Excl. in GC? --     Constitutional: Alert and oriented. Well appearing and in no acute distress. Eyes: Conjunctivae are normal Head: Atraumatic HEENT: No congestion/rhinnorhea. Mucous membranes are moist.  Oropharynx non-erythematous Neck:   Nontender with no meningismus, no masses, no stridor Cardiovascular: Normal rate, regular rhythm. Grossly normal heart sounds.  Good peripheral circulation. Respiratory: Normal respiratory effort.  No retractions. Lungs CTAB. Abdominal: Soft and  nontender. No distention. No guarding no rebound Back:  There is no focal tenderness or step off.  there is no midline tenderness there are no lesions noted. there is no CVA tenderness Musculoskeletal: No lower extremity tenderness, no upper extremity tenderness. No joint effusions, no DVT signs strong distal pulses no edema Neurologic: Cranial nerves II through XII are grossly intact 5 out of 5 strength bilateral upper and lower extremity. Finger to nose within normal limits heel to shin within normal limits, speech is normal with no word finding difficulty or dysarthria, reflexes symmetric, pupils are equally round and reactive to light, there is no pronator drift, sensation is normal, vision is intact to confrontation, gait is deferred, there is no nystagmus, normal neurologic exam Skin:  Skin is warm, dry and intact. No rash noted. Psychiatric: Mood and affect are normal. Speech and behavior are normal.  ____________________________________________   LABS (all labs ordered are listed, but only abnormal results are displayed)  Labs Reviewed  BASIC METABOLIC PANEL - Abnormal; Notable for the following components:      Result Value   Glucose, Bld 138 (*)    All other components within normal limits  CBC - Abnormal; Notable for the following components:   RBC 5.84 (*)    Hemoglobin 16.5 (*)    HCT 50.6 (*)    All other components within normal limits  URINALYSIS, COMPLETE (UACMP) WITH MICROSCOPIC  TROPONIN I  ETHANOL    Pertinent labs  results that were available during my care of the patient were reviewed by me and considered in my medical decision making (see chart for details). ____________________________________________  EKG  I personally interpreted any EKGs ordered by me or triage Sinus rhythm, rate 84, normal axis nonspecific ST changes no acute ST elevation or depression ____________________________________________  RADIOLOGY  Pertinent labs & imaging results that were  available during my care of the patient were reviewed by me and considered in my medical decision making (see chart for details). If possible, patient and/or family made aware of any abnormal findings.  No results found. ____________________________________________    PROCEDURES  Procedure(s) performed: None  Procedures  Critical Care performed: None  ____________________________________________   INITIAL IMPRESSION / ASSESSMENT AND PLAN / ED COURSE  Pertinent labs & imaging results that were available during my care of the patient were reviewed by me and considered in my medical decision making (see chart for details).  Here with signs and symptoms of recurrent chronic vertigo, well-appearing at this time, we will give her meclizine I will obtain imaging as that has not been done for this problem before, we will assess for other possible etiologies but does appear to be a chronic recurrent problem.  Also, nothing at this time to suggest acute CVA and if it were, she is outside the window for TPA.  ----------------------------------------- 9:06 PM on 04/25/2019 -----------------------------------------  He is  neurologically intact not suffering from any symptoms at this time states she feels under percent better would like to go home and eat.  CT scan is negative, we will ensure that she can ambulate prior to discharge.  ----------------------------------------- 9:54 PM on 04/25/2019 -----------------------------------------  Lates with a steady gait, remains neurologically intact with an NIH stroke scale of 0, tolerates p.o. with no difficulty, all of her symptoms subjective and objective have gone away, and there is no imaging or other evidence of CVA for this chronic recurrent vertiginous symptom we will refer her to neurology, we will send her home with medication which seems to work for her here, return precautions follow-up given understood we discussed admission but she is  adamant she would prefer to go home.   ____________________________________________   FINAL CLINICAL IMPRESSION(S) / ED DIAGNOSES  Final diagnoses:  Dizziness      This chart was dictated using voice recognition software.  Despite best efforts to proofread,  errors can occur which can change meaning.      Jeanmarie PlantMcShane, Raziel Koenigs A, MD 04/25/19 2015    Jeanmarie PlantMcShane, Jahmia Berrett A, MD 04/25/19 2106    Jeanmarie PlantMcShane, Hartlyn Reigel A, MD 04/25/19 2155

## 2019-04-25 NOTE — ED Triage Notes (Signed)
Pt to ED reporting dizziness x 2 days with an episode of vomiting and diaphoresis yesterday. HTN for the past 2 days. Pt able to ambualte in the lobby but needed assistance after stumbling. Pt I alert and oriented x 4 with no neurologic deficits noted at this time.

## 2019-04-25 NOTE — ED Notes (Signed)
Pt given graham crackers and peanut butter with Ginger Ale.

## 2019-05-11 ENCOUNTER — Ambulatory Visit
Admit: 2019-05-11 | Discharge: 2019-05-12 | Payer: PRIVATE HEALTH INSURANCE | Attending: Gynecologic Oncology | Primary: Gynecologic Oncology

## 2019-05-11 DIAGNOSIS — C541 Malignant neoplasm of endometrium: Principal | ICD-10-CM

## 2019-05-17 ENCOUNTER — Telehealth: Admit: 2019-05-17 | Discharge: 2019-05-18 | Payer: PRIVATE HEALTH INSURANCE | Attending: Neurology | Primary: Neurology

## 2019-05-17 DIAGNOSIS — H8110 Benign paroxysmal vertigo, unspecified ear: Principal | ICD-10-CM

## 2019-05-17 DIAGNOSIS — M62838 Other muscle spasm: Secondary | ICD-10-CM

## 2019-05-17 DIAGNOSIS — R42 Dizziness and giddiness: Secondary | ICD-10-CM

## 2019-05-17 MED ORDER — DIAZEPAM 5 MG TABLET
ORAL_TABLET | Freq: Three times a day (TID) | ORAL | 0 refills | 10 days | Status: CP | PRN
Start: 2019-05-17 — End: 2020-05-16

## 2019-05-28 ENCOUNTER — Telehealth
Admit: 2019-05-28 | Discharge: 2019-05-29 | Payer: PRIVATE HEALTH INSURANCE | Attending: Internal Medicine | Primary: Internal Medicine

## 2019-05-28 DIAGNOSIS — I10 Essential (primary) hypertension: Secondary | ICD-10-CM

## 2019-05-28 DIAGNOSIS — N63 Unspecified lump in unspecified breast: Secondary | ICD-10-CM

## 2019-05-28 DIAGNOSIS — E119 Type 2 diabetes mellitus without complications: Principal | ICD-10-CM

## 2019-05-28 DIAGNOSIS — H8110 Benign paroxysmal vertigo, unspecified ear: Secondary | ICD-10-CM

## 2019-05-28 DIAGNOSIS — R87612 Low grade squamous intraepithelial lesion on cytologic smear of cervix (LGSIL): Secondary | ICD-10-CM

## 2019-05-28 DIAGNOSIS — K859 Acute pancreatitis without necrosis or infection, unspecified: Secondary | ICD-10-CM

## 2019-05-28 DIAGNOSIS — C541 Malignant neoplasm of endometrium: Secondary | ICD-10-CM

## 2019-05-28 DIAGNOSIS — F418 Other specified anxiety disorders: Secondary | ICD-10-CM

## 2019-05-28 DIAGNOSIS — Z6841 Body Mass Index (BMI) 40.0 and over, adult: Secondary | ICD-10-CM

## 2019-05-28 MED ORDER — INSULIN GLARGINE (U-100) 100 UNIT/ML SUBCUTANEOUS SOLUTION
Freq: Two times a day (BID) | SUBCUTANEOUS | 4 refills | 25 days | Status: CP
Start: 2019-05-28 — End: ?

## 2019-05-28 MED ORDER — DULOXETINE 60 MG CAPSULE,DELAYED RELEASE
ORAL_CAPSULE | Freq: Every day | ORAL | 4 refills | 90.00000 days | Status: CP
Start: 2019-05-28 — End: ?

## 2019-05-28 MED ORDER — PEN NEEDLE, DIABETIC 32 GAUGE X 5/32" (4 MM)
Freq: Every day | 4 refills | 200.00000 days | Status: CP
Start: 2019-05-28 — End: 2019-06-02

## 2019-05-28 MED ORDER — BLOOD SUGAR DIAGNOSTIC STRIPS
ORAL_STRIP | Freq: Four times a day (QID) | 11 refills | 0.00000 days | Status: CP
Start: 2019-05-28 — End: 2019-06-27

## 2019-05-28 MED ORDER — BLOOD-GLUCOSE METER KIT WRAPPER
Freq: Once | 0 refills | 0.00000 days | Status: CP
Start: 2019-05-28 — End: 2019-05-28

## 2019-05-31 ENCOUNTER — Ambulatory Visit
Admit: 2019-05-31 | Discharge: 2019-06-29 | Payer: PRIVATE HEALTH INSURANCE | Attending: Rehabilitative and Restorative Service Providers" | Primary: Rehabilitative and Restorative Service Providers"

## 2019-05-31 DIAGNOSIS — R2689 Other abnormalities of gait and mobility: Secondary | ICD-10-CM

## 2019-05-31 DIAGNOSIS — M62838 Other muscle spasm: Secondary | ICD-10-CM

## 2019-05-31 DIAGNOSIS — H8110 Benign paroxysmal vertigo, unspecified ear: Principal | ICD-10-CM

## 2019-06-02 MED ORDER — PEN NEEDLE, DIABETIC 32 GAUGE X 5/32" (4 MM)
Freq: Every day | 4 refills | 200 days | Status: CP
Start: 2019-06-02 — End: ?

## 2019-06-04 ENCOUNTER — Ambulatory Visit: Admit: 2019-06-04 | Discharge: 2019-06-05 | Payer: PRIVATE HEALTH INSURANCE

## 2019-06-04 DIAGNOSIS — E119 Type 2 diabetes mellitus without complications: Principal | ICD-10-CM

## 2019-06-04 DIAGNOSIS — M62838 Other muscle spasm: Secondary | ICD-10-CM

## 2019-06-04 DIAGNOSIS — H8110 Benign paroxysmal vertigo, unspecified ear: Principal | ICD-10-CM

## 2019-06-04 DIAGNOSIS — R2689 Other abnormalities of gait and mobility: Secondary | ICD-10-CM

## 2019-06-18 MED ORDER — IBUPROFEN 800 MG TABLET
ORAL_TABLET | 2 refills | 0 days | Status: CP
Start: 2019-06-18 — End: ?

## 2019-06-22 MED ORDER — FREESTYLE LIBRE 14 DAY READER
Freq: Once | prn refills | 0.00000 days | Status: CP
Start: 2019-06-22 — End: 2019-06-22

## 2019-06-22 MED ORDER — FREESTYLE LIBRE 14 DAY SENSOR KIT
prn refills | 0.00000 days | Status: CP
Start: 2019-06-22 — End: ?

## 2019-07-13 ENCOUNTER — Ambulatory Visit: Admit: 2019-07-13 | Discharge: 2019-07-14 | Payer: PRIVATE HEALTH INSURANCE

## 2019-07-13 DIAGNOSIS — H43393 Other vitreous opacities, bilateral: Secondary | ICD-10-CM

## 2019-07-13 DIAGNOSIS — E113592 Type 2 diabetes mellitus with proliferative diabetic retinopathy without macular edema, left eye: Secondary | ICD-10-CM

## 2019-07-13 DIAGNOSIS — H43392 Other vitreous opacities, left eye: Secondary | ICD-10-CM

## 2019-07-13 DIAGNOSIS — H524 Presbyopia: Secondary | ICD-10-CM

## 2019-07-22 ENCOUNTER — Telehealth
Admit: 2019-07-22 | Discharge: 2019-07-23 | Payer: PRIVATE HEALTH INSURANCE | Attending: Internal Medicine | Primary: Internal Medicine

## 2019-07-22 DIAGNOSIS — Z794 Long term (current) use of insulin: Secondary | ICD-10-CM

## 2019-07-22 DIAGNOSIS — E1142 Type 2 diabetes mellitus with diabetic polyneuropathy: Principal | ICD-10-CM

## 2019-07-30 ENCOUNTER — Ambulatory Visit
Admit: 2019-07-30 | Discharge: 2019-08-28 | Payer: PRIVATE HEALTH INSURANCE | Attending: Rehabilitative and Restorative Service Providers" | Primary: Rehabilitative and Restorative Service Providers"

## 2019-07-30 DIAGNOSIS — R2689 Other abnormalities of gait and mobility: Secondary | ICD-10-CM

## 2019-07-30 DIAGNOSIS — H8110 Benign paroxysmal vertigo, unspecified ear: Secondary | ICD-10-CM

## 2019-07-30 DIAGNOSIS — M62838 Other muscle spasm: Secondary | ICD-10-CM

## 2019-09-03 ENCOUNTER — Ambulatory Visit
Admit: 2019-09-03 | Discharge: 2019-09-04 | Payer: PRIVATE HEALTH INSURANCE | Attending: Internal Medicine | Primary: Internal Medicine

## 2019-09-03 ENCOUNTER — Ambulatory Visit
Admit: 2019-09-03 | Discharge: 2019-09-27 | Payer: PRIVATE HEALTH INSURANCE | Attending: Rehabilitative and Restorative Service Providers" | Primary: Rehabilitative and Restorative Service Providers"

## 2019-09-03 DIAGNOSIS — Z6841 Body Mass Index (BMI) 40.0 and over, adult: Secondary | ICD-10-CM

## 2019-09-03 DIAGNOSIS — E1142 Type 2 diabetes mellitus with diabetic polyneuropathy: Principal | ICD-10-CM

## 2019-09-03 DIAGNOSIS — Z794 Long term (current) use of insulin: Secondary | ICD-10-CM

## 2019-09-03 DIAGNOSIS — Z0000 Encounter for general adult medical examination without abnormal findings: Principal | ICD-10-CM

## 2019-09-03 DIAGNOSIS — I10 Essential (primary) hypertension: Principal | ICD-10-CM

## 2019-09-07 ENCOUNTER — Ambulatory Visit
Admit: 2019-09-07 | Discharge: 2019-09-08 | Payer: PRIVATE HEALTH INSURANCE | Attending: "Endocrinology | Primary: "Endocrinology

## 2019-09-07 DIAGNOSIS — E1142 Type 2 diabetes mellitus with diabetic polyneuropathy: Principal | ICD-10-CM

## 2019-09-07 DIAGNOSIS — Z794 Long term (current) use of insulin: Secondary | ICD-10-CM

## 2019-09-07 DIAGNOSIS — E119 Type 2 diabetes mellitus without complications: Principal | ICD-10-CM

## 2019-09-07 MED ORDER — PEN NEEDLE, DIABETIC 32 GAUGE X 5/32" (4 MM)
Freq: Four times a day (QID) | 4 refills | 50 days | Status: CP
Start: 2019-09-07 — End: ?

## 2019-09-07 MED ORDER — INSULIN GLARGINE (U-100) 100 UNIT/ML SUBCUTANEOUS SOLUTION
Freq: Every evening | SUBCUTANEOUS | 4 refills | 66 days | Status: CP
Start: 2019-09-07 — End: ?

## 2019-09-07 MED ORDER — INSULIN ASPART (U-100) 100 UNIT/ML (3 ML) SUBCUTANEOUS PEN
12 refills | 0 days | Status: CP
Start: 2019-09-07 — End: ?

## 2019-09-08 MED ORDER — INSULIN LISPRO (U-100) 100 UNIT/ML SUBCUTANEOUS PEN
12 refills | 0 days | Status: CP
Start: 2019-09-08 — End: ?

## 2019-09-13 ENCOUNTER — Ambulatory Visit: Admit: 2019-09-13 | Discharge: 2019-09-13 | Payer: PRIVATE HEALTH INSURANCE

## 2019-10-05 ENCOUNTER — Institutional Professional Consult (permissible substitution)
Admit: 2019-10-05 | Discharge: 2019-10-06 | Payer: PRIVATE HEALTH INSURANCE | Attending: Pharmacist Clinician (PhC)/ Clinical Pharmacy Specialist | Primary: Pharmacist Clinician (PhC)/ Clinical Pharmacy Specialist

## 2019-11-26 ENCOUNTER — Ambulatory Visit
Admit: 2019-11-26 | Discharge: 2019-11-26 | Payer: PRIVATE HEALTH INSURANCE | Attending: Internal Medicine | Primary: Internal Medicine

## 2019-11-26 ENCOUNTER — Ambulatory Visit
Admit: 2019-11-26 | Discharge: 2019-11-26 | Payer: PRIVATE HEALTH INSURANCE | Attending: Gynecologic Oncology | Primary: Gynecologic Oncology

## 2019-11-26 DIAGNOSIS — Z6841 Body Mass Index (BMI) 40.0 and over, adult: Secondary | ICD-10-CM

## 2019-11-26 DIAGNOSIS — I10 Essential (primary) hypertension: Principal | ICD-10-CM

## 2019-11-26 DIAGNOSIS — Z794 Long term (current) use of insulin: Secondary | ICD-10-CM

## 2019-11-26 DIAGNOSIS — F17200 Nicotine dependence, unspecified, uncomplicated: Principal | ICD-10-CM

## 2019-11-26 DIAGNOSIS — F418 Other specified anxiety disorders: Principal | ICD-10-CM

## 2019-11-26 DIAGNOSIS — E1142 Type 2 diabetes mellitus with diabetic polyneuropathy: Principal | ICD-10-CM

## 2019-11-26 DIAGNOSIS — C541 Malignant neoplasm of endometrium: Principal | ICD-10-CM

## 2019-11-26 MED ORDER — DULOXETINE 30 MG CAPSULE,DELAYED RELEASE
ORAL_CAPSULE | Freq: Every day | ORAL | 4 refills | 90.00000 days | Status: CP
Start: 2019-11-26 — End: 2020-02-24

## 2019-12-02 ENCOUNTER — Ambulatory Visit: Admit: 2019-12-02 | Discharge: 2019-12-03 | Payer: PRIVATE HEALTH INSURANCE

## 2019-12-02 DIAGNOSIS — E113592 Type 2 diabetes mellitus with proliferative diabetic retinopathy without macular edema, left eye: Principal | ICD-10-CM

## 2019-12-02 DIAGNOSIS — H269 Unspecified cataract: Principal | ICD-10-CM

## 2019-12-02 DIAGNOSIS — H43393 Other vitreous opacities, bilateral: Principal | ICD-10-CM

## 2019-12-02 DIAGNOSIS — E113391 Type 2 diabetes mellitus with moderate nonproliferative diabetic retinopathy without macular edema, right eye: Principal | ICD-10-CM

## 2019-12-31 ENCOUNTER — Ambulatory Visit
Admit: 2019-12-31 | Discharge: 2020-01-01 | Payer: PRIVATE HEALTH INSURANCE | Attending: Gynecologic Oncology | Primary: Gynecologic Oncology

## 2020-01-03 ENCOUNTER — Telehealth
Admit: 2020-01-03 | Discharge: 2020-01-04 | Payer: PRIVATE HEALTH INSURANCE | Attending: Internal Medicine | Primary: Internal Medicine

## 2020-02-03 MED ORDER — EMPAGLIFLOZIN 25 MG TABLET
ORAL_TABLET | Freq: Every day | ORAL | 3 refills | 90.00000 days | Status: CP
Start: 2020-02-03 — End: ?

## 2020-02-04 ENCOUNTER — Ambulatory Visit
Admit: 2020-02-04 | Discharge: 2020-02-05 | Payer: PRIVATE HEALTH INSURANCE | Attending: "Endocrinology | Primary: "Endocrinology

## 2020-02-04 DIAGNOSIS — E1142 Type 2 diabetes mellitus with diabetic polyneuropathy: Principal | ICD-10-CM

## 2020-02-04 DIAGNOSIS — I10 Essential (primary) hypertension: Principal | ICD-10-CM

## 2020-02-04 DIAGNOSIS — Z794 Long term (current) use of insulin: Principal | ICD-10-CM

## 2020-02-04 MED ORDER — TRESIBA FLEXTOUCH U-200 INSULIN 200 UNIT/ML (3 ML) SUBCUTANEOUS PEN
3 refills | 0.00000 days | Status: CP
Start: 2020-02-04 — End: ?

## 2020-02-04 MED ORDER — ATORVASTATIN 20 MG TABLET
ORAL_TABLET | Freq: Every day | ORAL | 3 refills | 90 days | Status: CP
Start: 2020-02-04 — End: 2020-02-04

## 2020-02-04 MED ORDER — ATORVASTATIN 10 MG TABLET
ORAL_TABLET | Freq: Every day | ORAL | 3 refills | 90 days | Status: CP
Start: 2020-02-04 — End: 2021-02-03

## 2020-02-04 MED ORDER — INSULIN LISPRO (U-100) 100 UNIT/ML SUBCUTANEOUS PEN
12 refills | 0.00000 days | Status: CP
Start: 2020-02-04 — End: ?

## 2020-02-07 DIAGNOSIS — I10 Essential (primary) hypertension: Principal | ICD-10-CM

## 2020-02-08 DIAGNOSIS — E1142 Type 2 diabetes mellitus with diabetic polyneuropathy: Principal | ICD-10-CM

## 2020-02-08 DIAGNOSIS — Z794 Long term (current) use of insulin: Principal | ICD-10-CM

## 2020-02-08 MED ORDER — DEXAMETHASONE 1 MG TABLET
ORAL_TABLET | Freq: Once | ORAL | 0 refills | 1.00000 days | Status: CP
Start: 2020-02-08 — End: 2020-02-08

## 2020-02-08 MED ORDER — METOPROLOL TARTRATE 50 MG TABLET
ORAL_TABLET | Freq: Two times a day (BID) | ORAL | 4 refills | 90 days | Status: CP
Start: 2020-02-08 — End: ?

## 2020-02-08 MED ORDER — LISINOPRIL 20 MG-HYDROCHLOROTHIAZIDE 12.5 MG TABLET
ORAL_TABLET | Freq: Two times a day (BID) | ORAL | 4 refills | 90.00000 days | Status: CP
Start: 2020-02-08 — End: ?

## 2020-02-08 MED ORDER — GABAPENTIN 300 MG CAPSULE
ORAL_CAPSULE | Freq: Three times a day (TID) | ORAL | 4 refills | 90.00000 days | Status: CP
Start: 2020-02-08 — End: ?

## 2020-02-14 MED ORDER — EMPAGLIFLOZIN 25 MG TABLET
ORAL_TABLET | Freq: Every day | ORAL | 3 refills | 90 days | Status: CP
Start: 2020-02-14 — End: ?

## 2020-02-29 MED ORDER — IBUPROFEN 800 MG TABLET
ORAL_TABLET | 2 refills | 0.00000 days | Status: CP
Start: 2020-02-29 — End: ?

## 2020-04-05 ENCOUNTER — Ambulatory Visit
Admit: 2020-04-05 | Discharge: 2020-04-06 | Payer: PRIVATE HEALTH INSURANCE | Attending: Obesity Medicine | Primary: Obesity Medicine

## 2020-04-05 DIAGNOSIS — E1142 Type 2 diabetes mellitus with diabetic polyneuropathy: Principal | ICD-10-CM

## 2020-04-05 DIAGNOSIS — I10 Essential (primary) hypertension: Principal | ICD-10-CM

## 2020-04-05 DIAGNOSIS — Z794 Long term (current) use of insulin: Principal | ICD-10-CM

## 2020-04-05 DIAGNOSIS — R400 Somnolence: Principal | ICD-10-CM

## 2020-04-05 MED ORDER — TOPIRAMATE 25 MG TABLET
ORAL_TABLET | 3 refills | 0.00000 days | Status: CP
Start: 2020-04-05 — End: ?

## 2020-04-11 ENCOUNTER — Telehealth: Admit: 2020-04-11 | Payer: PRIVATE HEALTH INSURANCE | Attending: Internal Medicine | Primary: Internal Medicine

## 2020-04-11 MED ORDER — FLUCONAZOLE 150 MG TABLET
ORAL_TABLET | Freq: Once | ORAL | 0 refills | 2.00000 days | Status: CP
Start: 2020-04-11 — End: 2020-04-11

## 2020-04-13 MED ORDER — SEMAGLUTIDE 3 MG TABLET
ORAL_TABLET | Freq: Every day | ORAL | 1 refills | 0.00000 days | Status: CP
Start: 2020-04-13 — End: ?

## 2020-04-13 MED ORDER — METFORMIN 500 MG TABLET
ORAL_TABLET | Freq: Two times a day (BID) | ORAL | 11 refills | 30.00000 days | Status: CP
Start: 2020-04-13 — End: 2021-04-13

## 2020-05-01 ENCOUNTER — Ambulatory Visit: Admit: 2020-05-01 | Discharge: 2020-05-01 | Payer: PRIVATE HEALTH INSURANCE

## 2020-05-01 ENCOUNTER — Institutional Professional Consult (permissible substitution)
Admit: 2020-05-01 | Discharge: 2020-05-01 | Payer: PRIVATE HEALTH INSURANCE | Attending: Internal Medicine | Primary: Internal Medicine

## 2020-05-01 DIAGNOSIS — E1142 Type 2 diabetes mellitus with diabetic polyneuropathy: Principal | ICD-10-CM

## 2020-05-01 DIAGNOSIS — Z794 Long term (current) use of insulin: Secondary | ICD-10-CM

## 2020-05-01 DIAGNOSIS — I10 Essential (primary) hypertension: Principal | ICD-10-CM

## 2020-05-01 DIAGNOSIS — R1110 Vomiting, unspecified: Principal | ICD-10-CM

## 2020-05-01 DIAGNOSIS — R609 Edema, unspecified: Principal | ICD-10-CM

## 2020-05-01 DIAGNOSIS — R112 Nausea with vomiting, unspecified: Principal | ICD-10-CM

## 2020-05-01 MED ORDER — DULOXETINE 30 MG CAPSULE,DELAYED RELEASE
ORAL_CAPSULE | Freq: Every day | ORAL | 4 refills | 90.00000 days | Status: CP
Start: 2020-05-01 — End: 2020-07-30

## 2020-05-01 MED ORDER — PROMETHAZINE 12.5 MG TABLET
ORAL_TABLET | Freq: Four times a day (QID) | ORAL | 0 refills | 5 days | Status: CP | PRN
Start: 2020-05-01 — End: 2020-05-08
  Filled 2020-05-01: qty 20, 5d supply, fill #0

## 2020-05-01 MED FILL — PROMETHAZINE 12.5 MG TABLET: 5 days supply | Qty: 20 | Fill #0 | Status: AC

## 2020-05-03 ENCOUNTER — Ambulatory Visit: Admit: 2020-05-03 | Discharge: 2020-05-03 | Payer: PRIVATE HEALTH INSURANCE

## 2020-05-03 ENCOUNTER — Ambulatory Visit
Admit: 2020-05-03 | Discharge: 2020-05-03 | Payer: PRIVATE HEALTH INSURANCE | Attending: "Endocrinology | Primary: "Endocrinology

## 2020-05-03 DIAGNOSIS — E1142 Type 2 diabetes mellitus with diabetic polyneuropathy: Principal | ICD-10-CM

## 2020-05-03 DIAGNOSIS — Z794 Long term (current) use of insulin: Secondary | ICD-10-CM

## 2020-05-03 MED ORDER — DEXCOM G6 RECEIVER MISC
0 refills | 0.00000 days | Status: CP
Start: 2020-05-03 — End: ?

## 2020-05-03 MED ORDER — LANTUS SOLOSTAR U-100 INSULIN 100 UNIT/ML (3 ML) SUBCUTANEOUS PEN
Freq: Two times a day (BID) | SUBCUTANEOUS | 3 refills | 90.00000 days | Status: CP
Start: 2020-05-03 — End: 2021-05-03

## 2020-05-03 MED ORDER — DEXCOM G6 TRANSMITTER DEVICE
0 refills | 0.00000 days | Status: CP
Start: 2020-05-03 — End: ?

## 2020-05-03 MED ORDER — INSULIN LISPRO (U-100) 100 UNIT/ML SUBCUTANEOUS PEN
Freq: Every day | SUBCUTANEOUS | 3 refills | 90.00000 days | Status: CP
Start: 2020-05-03 — End: 2021-05-03

## 2020-05-03 MED ORDER — DEXCOM G6 SENSOR DEVICE
11 refills | 0.00000 days | Status: CP
Start: 2020-05-03 — End: ?

## 2020-05-05 MED ORDER — WEGOVY 0.25 MG/0.5 ML SUBCUTANEOUS PEN INJECTOR
SUBCUTANEOUS | 0 refills | 0.00000 days | Status: CP
Start: 2020-05-05 — End: 2020-05-27

## 2020-05-09 ENCOUNTER — Ambulatory Visit: Admit: 2020-05-09 | Discharge: 2020-05-10 | Payer: PRIVATE HEALTH INSURANCE

## 2020-05-09 DIAGNOSIS — E1142 Type 2 diabetes mellitus with diabetic polyneuropathy: Principal | ICD-10-CM

## 2020-05-09 DIAGNOSIS — Z794 Long term (current) use of insulin: Secondary | ICD-10-CM

## 2020-05-15 ENCOUNTER — Ambulatory Visit: Admit: 2020-05-15 | Discharge: 2020-05-16 | Payer: PRIVATE HEALTH INSURANCE

## 2020-05-22 DIAGNOSIS — E1142 Type 2 diabetes mellitus with diabetic polyneuropathy: Principal | ICD-10-CM

## 2020-05-22 DIAGNOSIS — Z794 Long term (current) use of insulin: Principal | ICD-10-CM

## 2020-05-22 MED ORDER — OZEMPIC 0.25 MG OR 0.5 MG (2 MG/1.5 ML) SUBCUTANEOUS PEN INJECTOR
SUBCUTANEOUS | 0 refills | 84.00000 days | Status: CP
Start: 2020-05-22 — End: 2020-08-20

## 2020-06-02 DIAGNOSIS — Z794 Long term (current) use of insulin: Principal | ICD-10-CM

## 2020-06-02 DIAGNOSIS — E1142 Type 2 diabetes mellitus with diabetic polyneuropathy: Principal | ICD-10-CM

## 2020-06-02 MED ORDER — WEGOVY 0.5 MG/0.5 ML SUBCUTANEOUS PEN INJECTOR
SUBCUTANEOUS | 0 refills | 0.00000 days | Status: CP
Start: 2020-06-02 — End: 2020-06-24

## 2020-06-05 ENCOUNTER — Ambulatory Visit: Admit: 2020-06-05 | Discharge: 2020-06-06 | Payer: PRIVATE HEALTH INSURANCE

## 2020-06-05 DIAGNOSIS — R600 Localized edema: Principal | ICD-10-CM

## 2020-06-05 DIAGNOSIS — I10 Essential (primary) hypertension: Principal | ICD-10-CM

## 2020-06-05 MED ORDER — LISINOPRIL 20 MG-HYDROCHLOROTHIAZIDE 25 MG TABLET
ORAL_TABLET | Freq: Every day | ORAL | 3 refills | 30.00000 days | Status: CP
Start: 2020-06-05 — End: ?

## 2020-06-12 ENCOUNTER — Ambulatory Visit
Admit: 2020-06-12 | Discharge: 2020-06-13 | Payer: PRIVATE HEALTH INSURANCE | Attending: Internal Medicine | Primary: Internal Medicine

## 2020-06-12 DIAGNOSIS — H8110 Benign paroxysmal vertigo, unspecified ear: Principal | ICD-10-CM

## 2020-06-12 DIAGNOSIS — M62838 Other muscle spasm: Principal | ICD-10-CM

## 2020-06-12 DIAGNOSIS — R21 Rash and other nonspecific skin eruption: Principal | ICD-10-CM

## 2020-06-12 DIAGNOSIS — Z1231 Encounter for screening mammogram for malignant neoplasm of breast: Principal | ICD-10-CM

## 2020-06-12 DIAGNOSIS — I10 Essential (primary) hypertension: Principal | ICD-10-CM

## 2020-06-12 MED ORDER — TRIAMCINOLONE ACETONIDE 0.1 % TOPICAL CREAM
Freq: Two times a day (BID) | TOPICAL | 12 refills | 0.00000 days | Status: CP
Start: 2020-06-12 — End: ?

## 2020-06-12 MED ORDER — SPIRONOLACTONE 25 MG TABLET
ORAL_TABLET | Freq: Every day | ORAL | 11 refills | 30 days | Status: CP
Start: 2020-06-12 — End: 2021-06-12

## 2020-06-12 MED ORDER — DIAZEPAM 5 MG TABLET
ORAL_TABLET | Freq: Three times a day (TID) | ORAL | 0 refills | 10.00000 days | Status: CP | PRN
Start: 2020-06-12 — End: 2021-06-12

## 2020-06-15 ENCOUNTER — Ambulatory Visit
Admit: 2020-06-15 | Payer: PRIVATE HEALTH INSURANCE | Attending: Pharmacist Clinician (PhC)/ Clinical Pharmacy Specialist | Primary: Pharmacist Clinician (PhC)/ Clinical Pharmacy Specialist

## 2020-06-21 ENCOUNTER — Ambulatory Visit
Admit: 2020-06-21 | Discharge: 2020-06-22 | Payer: PRIVATE HEALTH INSURANCE | Attending: Obesity Medicine | Primary: Obesity Medicine

## 2020-06-21 MED ORDER — OZEMPIC 0.25 MG OR 0.5 MG (2 MG/1.5 ML) SUBCUTANEOUS PEN INJECTOR
SUBCUTANEOUS | 1 refills | 28.00000 days | Status: CP
Start: 2020-06-21 — End: 2020-07-21

## 2020-06-26 ENCOUNTER — Institutional Professional Consult (permissible substitution)
Admit: 2020-06-26 | Discharge: 2020-06-27 | Payer: PRIVATE HEALTH INSURANCE | Attending: Registered" | Primary: Registered"

## 2020-06-30 ENCOUNTER — Ambulatory Visit
Admit: 2020-06-30 | Discharge: 2020-07-01 | Payer: PRIVATE HEALTH INSURANCE | Attending: Internal Medicine | Primary: Internal Medicine

## 2020-06-30 DIAGNOSIS — Z794 Long term (current) use of insulin: Principal | ICD-10-CM

## 2020-06-30 DIAGNOSIS — E1142 Type 2 diabetes mellitus with diabetic polyneuropathy: Secondary | ICD-10-CM

## 2020-06-30 DIAGNOSIS — Z6841 Body Mass Index (BMI) 40.0 and over, adult: Secondary | ICD-10-CM

## 2020-06-30 DIAGNOSIS — I10 Essential (primary) hypertension: Principal | ICD-10-CM

## 2020-06-30 DIAGNOSIS — R748 Abnormal levels of other serum enzymes: Principal | ICD-10-CM

## 2020-06-30 DIAGNOSIS — C541 Malignant neoplasm of endometrium: Principal | ICD-10-CM

## 2020-06-30 MED ORDER — WEGOVY 1 MG/0.5 ML SUBCUTANEOUS PEN INJECTOR
SUBCUTANEOUS | 0 refills | 0.00000 days | Status: CP
Start: 2020-06-30 — End: 2020-07-22

## 2020-07-03 ENCOUNTER — Telehealth: Admit: 2020-07-03 | Discharge: 2020-07-04 | Payer: PRIVATE HEALTH INSURANCE | Attending: Family | Primary: Family

## 2020-07-18 MED ORDER — OZEMPIC 0.25 MG OR 0.5 MG (2 MG/1.5 ML) SUBCUTANEOUS PEN INJECTOR
SUBCUTANEOUS | 0 refills | 0.00000 days | Status: CP
Start: 2020-07-18 — End: ?

## 2020-08-03 ENCOUNTER — Ambulatory Visit: Admit: 2020-08-03 | Payer: PRIVATE HEALTH INSURANCE | Attending: Gynecologic Oncology | Primary: Gynecologic Oncology

## 2020-09-06 DIAGNOSIS — Z1231 Encounter for screening mammogram for malignant neoplasm of breast: Principal | ICD-10-CM

## 2020-09-07 MED ORDER — DEXCOM G6 TRANSMITTER DEVICE: each | 0 refills | 0 days | Status: AC

## 2020-09-07 MED ORDER — DEXCOM G6 TRANSMITTER DEVICE
0 refills | 0.00000 days | Status: CP
Start: 2020-09-07 — End: ?

## 2020-09-12 ENCOUNTER — Ambulatory Visit: Admit: 2020-09-12 | Discharge: 2020-09-13 | Payer: PRIVATE HEALTH INSURANCE

## 2020-09-12 DIAGNOSIS — L089 Local infection of the skin and subcutaneous tissue, unspecified: Principal | ICD-10-CM

## 2020-09-12 DIAGNOSIS — L0291 Cutaneous abscess, unspecified: Principal | ICD-10-CM

## 2020-09-12 MED ORDER — DOXYCYCLINE HYCLATE 100 MG TABLET
ORAL_TABLET | Freq: Two times a day (BID) | ORAL | 0 refills | 14.00000 days | Status: CP
Start: 2020-09-12 — End: 2020-09-19

## 2020-09-19 ENCOUNTER — Ambulatory Visit: Admit: 2020-09-19 | Discharge: 2020-09-20 | Payer: PRIVATE HEALTH INSURANCE

## 2020-09-19 DIAGNOSIS — L089 Local infection of the skin and subcutaneous tissue, unspecified: Principal | ICD-10-CM

## 2020-09-19 MED ORDER — DOXYCYCLINE HYCLATE 100 MG TABLET
ORAL_TABLET | Freq: Two times a day (BID) | ORAL | 0 refills | 14.00000 days | Status: CP
Start: 2020-09-19 — End: ?

## 2020-10-17 ENCOUNTER — Ambulatory Visit: Admit: 2020-10-17 | Discharge: 2020-10-18 | Payer: PRIVATE HEALTH INSURANCE

## 2020-10-17 DIAGNOSIS — L0291 Cutaneous abscess, unspecified: Principal | ICD-10-CM

## 2020-10-17 DIAGNOSIS — E1142 Type 2 diabetes mellitus with diabetic polyneuropathy: Principal | ICD-10-CM

## 2020-10-17 DIAGNOSIS — L720 Epidermal cyst: Principal | ICD-10-CM

## 2020-10-17 DIAGNOSIS — Z794 Long term (current) use of insulin: Principal | ICD-10-CM

## 2020-10-17 DIAGNOSIS — A429 Actinomycosis, unspecified: Principal | ICD-10-CM

## 2020-10-17 MED ORDER — LISINOPRIL 20 MG-HYDROCHLOROTHIAZIDE 25 MG TABLET
ORAL_TABLET | Freq: Every day | ORAL | 2 refills | 90 days | Status: CP
Start: 2020-10-17 — End: ?

## 2020-10-31 IMAGING — CT CT HEAD WITHOUT CONTRAST
3 of 4 series · 16 of 47 positions shown, 19 images · non-contrast
Comparison: None.

CLINICAL DATA: Blood pressure, felt wobbly and shaky. Dizziness for
2 days.

EXAM:
CT HEAD WITHOUT CONTRAST
TECHNIQUE: Contiguous axial images were obtained from the base of the skull
through the vertex without intravenous contrast.

[Series 3: head wo · axial · 0.40mm/px · z∈[-175,-50]mm · 10 of 31 slices shown, 13 images]
[im 3/31  brain]
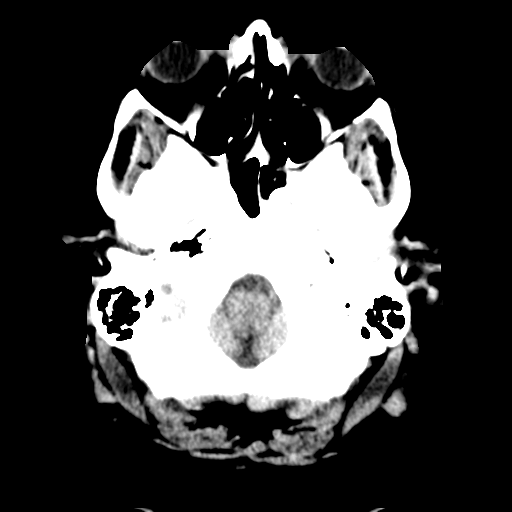
[im 3/31  bone]
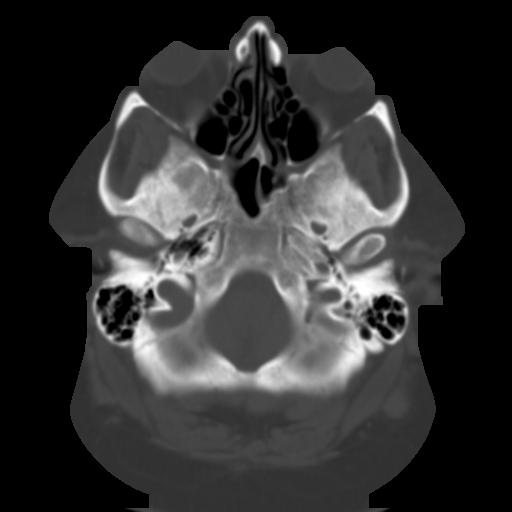
[im 5/31  brain]
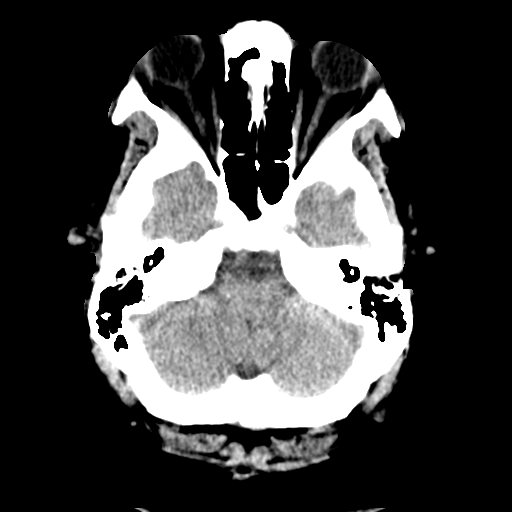
[im 9/31  brain]
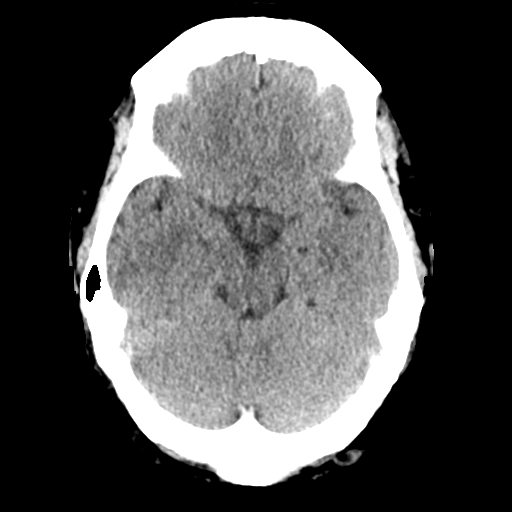
[im 11/31  brain]
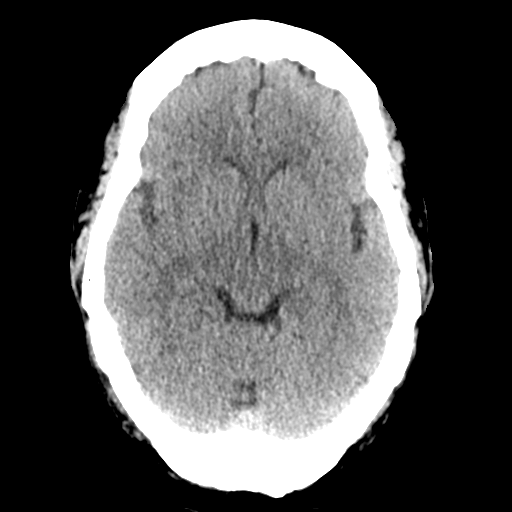
[im 13/31  brain]
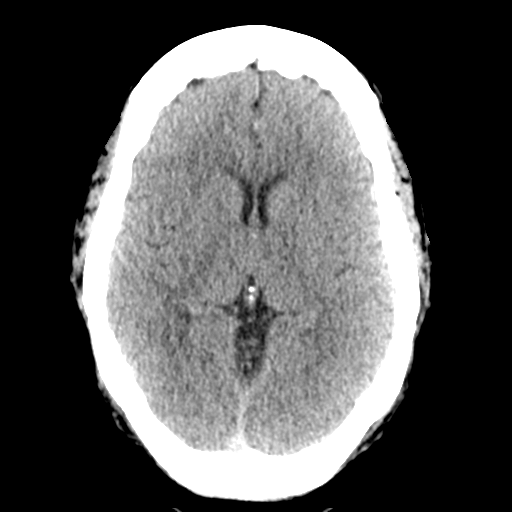
[im 13/31  bone]
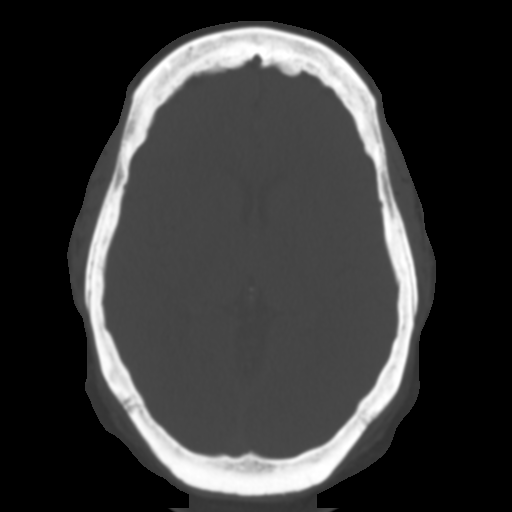
[im 18/31  brain]
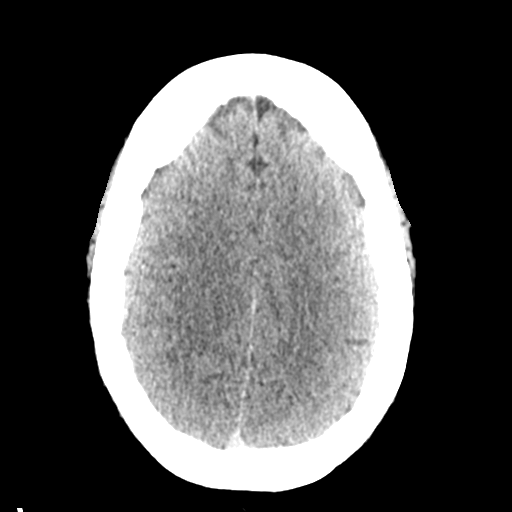
[im 20/31  brain]
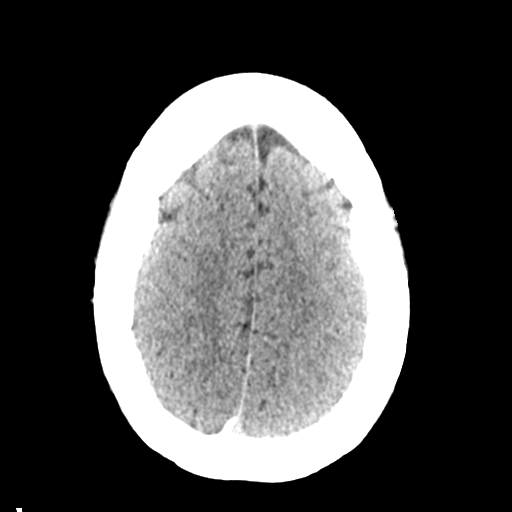
[im 22/31  brain]
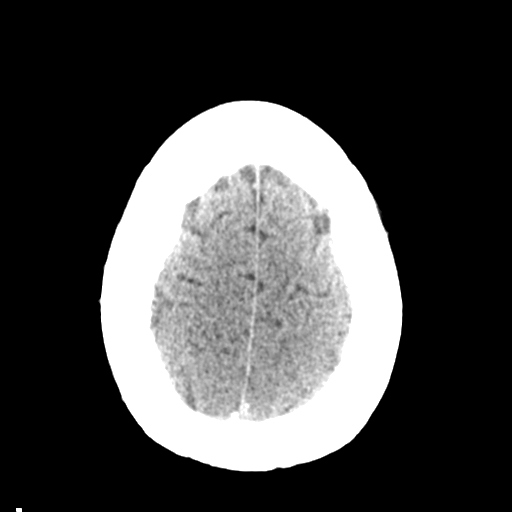
[im 26/31  brain]
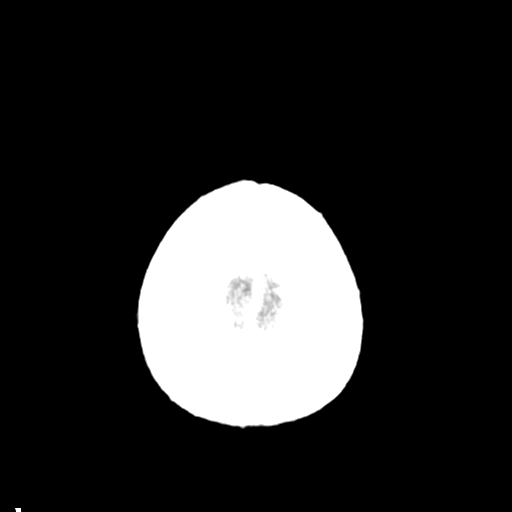
[im 26/31  bone]
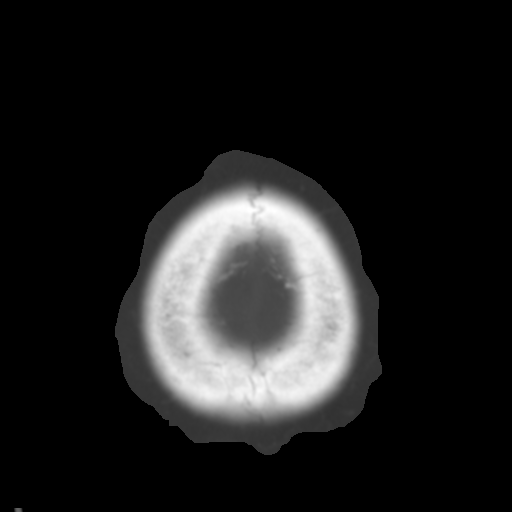
[im 28/31  brain]
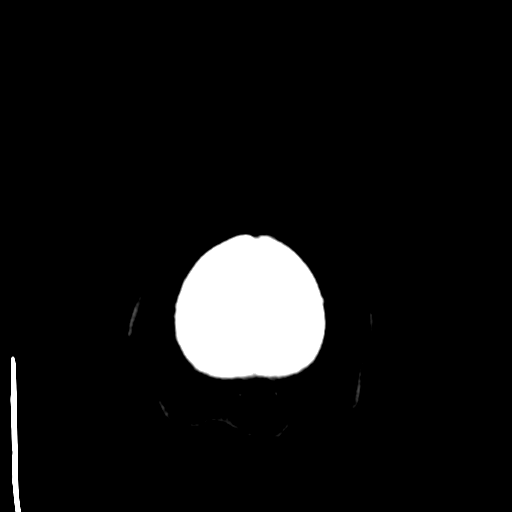

[Series 4: coronal soft tissue · coronal · 0.31mm/px · 3 of 67 slices shown]
[im 23/67  brain]
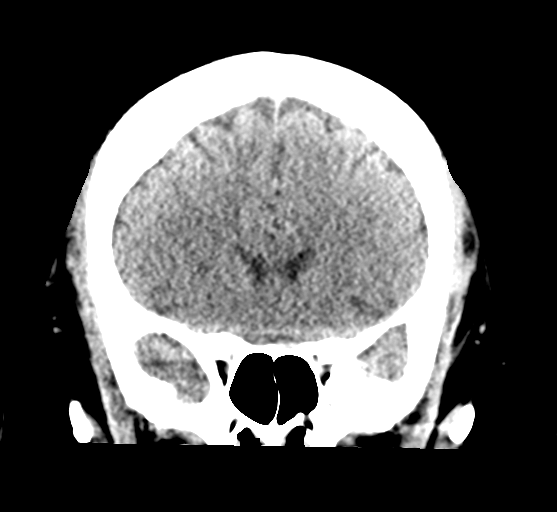
[im 30/67  brain]
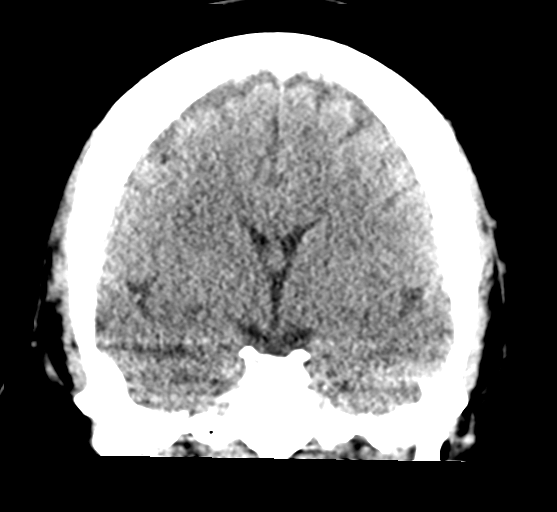
[im 37/67  brain]
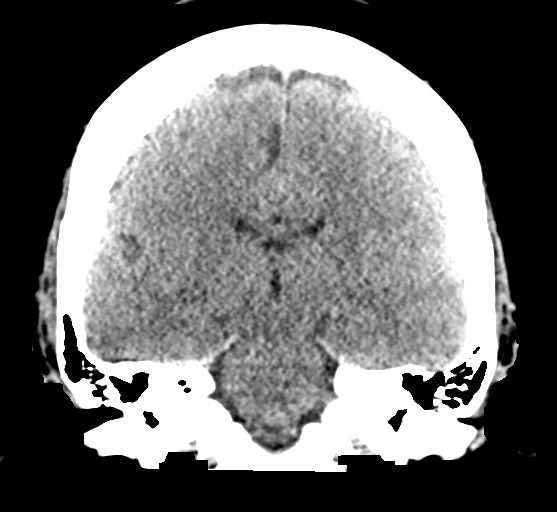

[Series 5: sagittal soft tissue · sagittal · 0.33mm/px · 3 of 53 slices shown]
[im 18/53  brain]
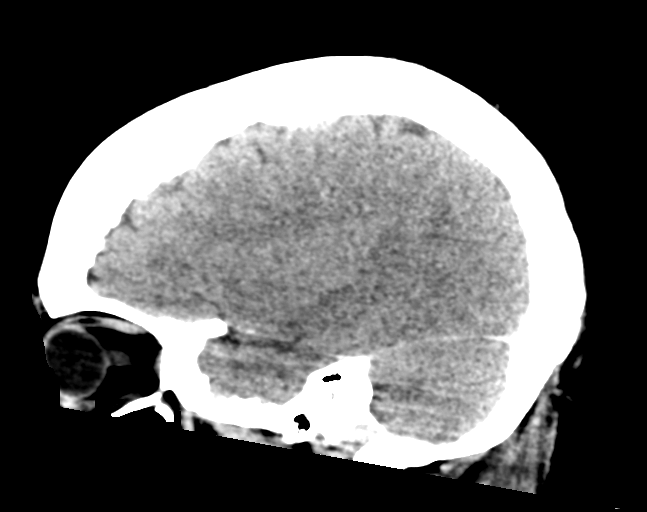
[im 27/53  brain]
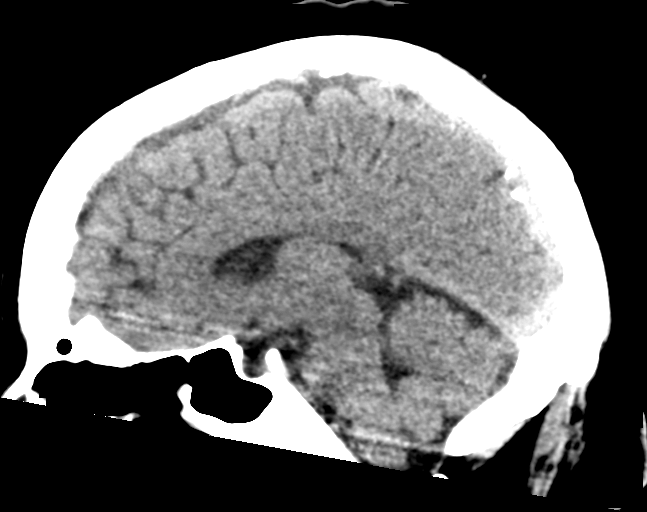
[im 35/53  brain]
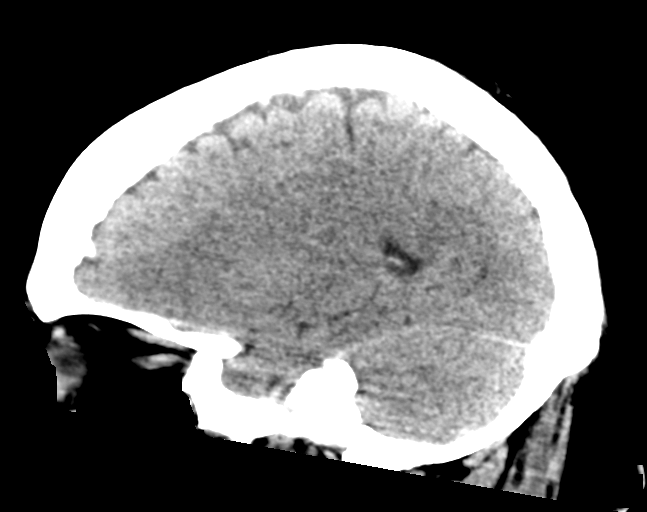

[16 of 47 positions shown; findings below may reference images not displayed]

FINDINGS: Brain: No evidence of acute infarction, hemorrhage, hydrocephalus,
extra-axial collection or mass lesion/mass effect.

Vascular: No hyperdense vessel.

Skull: No fracture or focal lesion.

Sinuses/Orbits: Paranasal sinuses and mastoid air cells are clear.
The visualized orbits are unremarkable.

Other: None.
IMPRESSION: Negative head CT.

## 2020-12-25 ENCOUNTER — Ambulatory Visit: Admit: 2020-12-25 | Discharge: 2020-12-26 | Payer: PRIVATE HEALTH INSURANCE

## 2020-12-25 DIAGNOSIS — R768 Other specified abnormal immunological findings in serum: Principal | ICD-10-CM

## 2020-12-28 MED ORDER — DULOXETINE 30 MG CAPSULE,DELAYED RELEASE
ORAL_CAPSULE | 4 refills | 0 days | Status: CP
Start: 2020-12-28 — End: ?

## 2021-02-06 MED ORDER — DEXCOM G6 TRANSMITTER DEVICE
0 refills | 0.00000 days | Status: CP
Start: 2021-02-06 — End: ?

## 2021-02-28 MED ORDER — GABAPENTIN 300 MG CAPSULE
ORAL_CAPSULE | ORAL | 1 refills | 0.00000 days | Status: CP
Start: 2021-02-28 — End: ?

## 2021-03-07 MED ORDER — ATORVASTATIN 10 MG TABLET
ORAL_TABLET | Freq: Every day | ORAL | 3 refills | 90 days | Status: CP
Start: 2021-03-07 — End: 2022-03-07

## 2021-04-11 DIAGNOSIS — I10 Essential (primary) hypertension: Principal | ICD-10-CM

## 2021-04-11 MED ORDER — IBUPROFEN 800 MG TABLET
ORAL_TABLET | 2 refills | 0 days | Status: CP
Start: 2021-04-11 — End: ?

## 2021-04-11 MED ORDER — METOPROLOL TARTRATE 50 MG TABLET
ORAL_TABLET | Freq: Two times a day (BID) | ORAL | 0 refills | 90.00000 days | Status: CP
Start: 2021-04-11 — End: 2021-06-30

## 2021-04-11 MED ORDER — SPIRONOLACTONE 25 MG TABLET
ORAL_TABLET | Freq: Every day | ORAL | 11 refills | 30 days | Status: CP
Start: 2021-04-11 — End: 2022-04-11

## 2021-04-12 MED ORDER — INSULIN LISPRO (U-100) 100 UNIT/ML SUBCUTANEOUS PEN
Freq: Every day | SUBCUTANEOUS | 3 refills | 90.00000 days | Status: CP
Start: 2021-04-12 — End: 2021-04-12

## 2021-04-12 MED ORDER — OZEMPIC 0.25 MG OR 0.5 MG (2 MG/1.5 ML) SUBCUTANEOUS PEN INJECTOR
SUBCUTANEOUS | 0 refills | 7.00000 days | Status: CP
Start: 2021-04-12 — End: 2021-04-12

## 2021-04-12 MED ORDER — LANTUS SOLOSTAR U-100 INSULIN 100 UNIT/ML (3 ML) SUBCUTANEOUS PEN
Freq: Two times a day (BID) | SUBCUTANEOUS | 3 refills | 90 days | Status: CP
Start: 2021-04-12 — End: 2022-04-12

## 2021-09-24 ENCOUNTER — Ambulatory Visit
Admit: 2021-09-24 | Payer: PRIVATE HEALTH INSURANCE | Attending: Student in an Organized Health Care Education/Training Program | Primary: Student in an Organized Health Care Education/Training Program

## 2021-10-01 ENCOUNTER — Ambulatory Visit
Admit: 2021-10-01 | Discharge: 2021-10-02 | Payer: PRIVATE HEALTH INSURANCE | Attending: Student in an Organized Health Care Education/Training Program | Primary: Student in an Organized Health Care Education/Training Program

## 2021-10-01 DIAGNOSIS — L089 Local infection of the skin and subcutaneous tissue, unspecified: Principal | ICD-10-CM

## 2021-10-01 DIAGNOSIS — L0291 Cutaneous abscess, unspecified: Principal | ICD-10-CM

## 2021-10-01 MED ORDER — DOXYCYCLINE HYCLATE 100 MG TABLET
ORAL_TABLET | Freq: Two times a day (BID) | ORAL | 0 refills | 14.00000 days | Status: CP
Start: 2021-10-01 — End: 2021-10-15

## 2021-10-03 DIAGNOSIS — L0291 Cutaneous abscess, unspecified: Principal | ICD-10-CM

## 2021-10-03 MED ORDER — CIPROFLOXACIN 500 MG TABLET
ORAL_TABLET | Freq: Two times a day (BID) | ORAL | 0 refills | 10.00000 days | Status: CP
Start: 2021-10-03 — End: 2021-10-13

## 2021-11-07 ENCOUNTER — Ambulatory Visit: Admit: 2021-11-07 | Discharge: 2021-11-08 | Payer: PRIVATE HEALTH INSURANCE

## 2021-11-07 DIAGNOSIS — I10 Essential (primary) hypertension: Principal | ICD-10-CM

## 2021-11-07 DIAGNOSIS — B379 Candidiasis, unspecified: Principal | ICD-10-CM

## 2021-11-07 DIAGNOSIS — E1142 Type 2 diabetes mellitus with diabetic polyneuropathy: Principal | ICD-10-CM

## 2021-11-07 DIAGNOSIS — Z794 Long term (current) use of insulin: Principal | ICD-10-CM

## 2021-11-07 MED ORDER — FLUCONAZOLE 150 MG TABLET
ORAL_TABLET | Freq: Once | ORAL | 0 refills | 1.00000 days | Status: CP
Start: 2021-11-07 — End: 2021-11-07

## 2021-11-07 MED ORDER — METFORMIN ER 500 MG TABLET,EXTENDED RELEASE 24 HR
ORAL_TABLET | Freq: Every day | ORAL | 3 refills | 45 days | Status: CP
Start: 2021-11-07 — End: 2022-11-07

## 2021-11-11 ENCOUNTER — Encounter: Payer: Self-pay | Admitting: Emergency Medicine

## 2021-11-11 ENCOUNTER — Ambulatory Visit
Admission: EM | Admit: 2021-11-11 | Discharge: 2021-11-11 | Disposition: A | Discharge status: Home / Self Care | Payer: 59

## 2021-11-11 DIAGNOSIS — M25512 Pain in left shoulder: Secondary | ICD-10-CM

## 2021-11-11 MED ORDER — METHOCARBAMOL 500 MG PO TABS
500.0000 mg | ORAL_TABLET | Freq: Two times a day (BID) | ORAL | 0 refills | Status: AC
Start: 2021-11-11 — End: ?

## 2021-11-11 MED ORDER — MELOXICAM 15 MG PO TABS
15.0000 mg | ORAL_TABLET | Freq: Every day | ORAL | 0 refills | Status: AC
Start: 2021-11-11 — End: ?

## 2021-11-11 NOTE — ED Provider Notes (Signed)
Roderic Palau    CSN: [id number] Arrival date & time: 11/11/21  1510      History   Chief Complaint Chief Complaint  Patient presents with   Shoulder Pain    HPI Sherri Escobar is a 51 y.o. female.   HPI  Shoulder Pain: Patient states that over the past 3 days she has had left shoulder pain.  No known injury.  Symptoms started when she woke up so she thinks she may have slept on it wrong.  Pain is on the anterior part of the shoulder.  Hurts to palpation and range of motion.  She has tried over-the-counter topicals and one 800 mg ibuprofen for symptoms without much improvement. No neuro changes, weakness, rash.   Past Medical History:  Diagnosis Date   Diabetes mellitus without complication (HCC)    High cholesterol    Hypertension     There are no problems to display for this patient.   Past Surgical History:  Procedure Laterality Date   LIPOMA RESECTION      OB History   No obstetric history on file.      Home Medications    Prior to Admission medications   Medication Sig Start Date End Date Taking? Authorizing Provider  DULoxetine (CYMBALTA) 30 MG capsule Take 90 mg by mouth daily. 11/03/21  Yes [provider]  gabapentin (NEURONTIN) 300 MG capsule Take 300 mg by mouth 3 (three) times daily. 11/03/21  Yes [provider]  HUMALOG KWIKPEN 100 UNIT/ML KwikPen SMARTSIG:0-50 Unit(s) SUB-Q 3 Times Daily 10/16/21  Yes [provider]  hydrOXYzine (ATARAX) 50 MG tablet hydroxyzine HCl 50 mg tablet   Yes [provider]  ibuprofen (ADVIL,MOTRIN) 800 MG tablet Take 1 tablet (800 mg total) by mouth every 8 (eight) hours as needed. 05/25/15  Yes Mortimer Fries, PA-C  LANTUS SOLOSTAR 100 UNIT/ML Solostar Pen SMARTSIG:50 Unit(s) SUB-Q Twice Daily 11/03/21  Yes [provider]  lisinopril-hydrochlorothiazide (PRINZIDE,ZESTORETIC) 20-25 MG tablet Take 1 tablet by mouth 2 (two) times daily.   Yes [provider]   meloxicam (MOBIC) 15 MG tablet Take 1 tablet (15 mg total) by mouth daily. 11/11/21  Yes Raistlin Gum M, PA-C  metFORMIN (GLUCOPHAGE) 500 MG tablet Take 500 mg by mouth 2 (two) times daily. 10/08/21  Yes [provider]  methocarbamol (ROBAXIN) 500 MG tablet Take 1 tablet (500 mg total) by mouth 2 (two) times daily. 11/11/21  Yes Ronold Hardgrove, Judson Roch M, PA-C  METOPROLOL TARTRATE PO Take by mouth 2 (two) times daily.   Yes [provider]  OZEMPIC, 0.25 OR 0.5 MG/DOSE, 2 MG/1.5ML SOPN Inject into the skin. 11/07/21  Yes [provider]  amoxicillin (AMOXIL) 500 MG tablet Take 1 tablet (500 mg total) by mouth 3 (three) times daily. 05/25/15   Mortimer Fries, PA-C  meclizine (ANTIVERT) 25 MG tablet Take 1 tablet (25 mg total) by mouth 3 (three) times daily as needed for dizziness. 04/25/19   Schuyler Amor, MD  spironolactone (ALDACTONE) 50 MG tablet Take 50 mg by mouth daily. 10/08/21   [provider]    Family History History reviewed. No pertinent family history.  Social History Social History   Tobacco Use   Smoking status: Every Day    Packs/day: 0.50    Types: Cigarettes   Smokeless tobacco: Never  Vaping Use   Vaping Use: Never used  Substance Use Topics   Alcohol use: No   Drug use: Never     Allergies  Cashew nut oil, Empagliflozin, Justicia adhatoda (malabar nut tree) [justicia adhatoda], Statins, and Zofran [ondansetron hcl]   Review of Systems Review of Systems  As stated above in HPI Physical Exam Triage Vital Signs ED Triage Vitals  Enc Vitals Group     BP 11/11/21 1523 (!) 146/91     Pulse Rate 11/11/21 1523 98     Resp 11/11/21 1523 18     Temp 11/11/21 1523 98.6 F (37 C)     Temp Source 11/11/21 1523 Oral     SpO2 11/11/21 1523 97 %     Weight --      Height --      Head Circumference --      Peak Flow --      Pain Score 11/11/21 1519 8     Pain Loc --      Pain Edu? --      Excl. in GC? --    No data  found.  Updated Vital Signs BP (!) 146/91 (BP Location: Right Wrist)    Pulse 98    Temp 98.6 F (37 C) (Oral)    Resp 18    LMP 09/15/2016 (Within Weeks)    SpO2 97%    Physical Exam Vitals and nursing note reviewed.  Cardiovascular:     Rate and Rhythm: Normal rate and regular rhythm.     Heart sounds: Normal heart sounds.  Musculoskeletal:        General: No swelling, deformity or signs of injury.     Right lower leg: No edema.     Left lower leg: No edema.     Comments: Limited ROM of the left shoulder due to pain though when she gets up on examination table extension angle is 90 degrees. NO increased warmth, edema, erythema or ecchymosis of shoulder. Mild to moderate tenderness to palpation of anterior shoulder. No bony tenderness or gross abnormality. No midline tenderness of spine. Grip strength intact. There is muscle tension and some reproducible tenderness of the left trapezius muscle.   Skin:    General: Skin is warm.     Coloration: Skin is not pale.     Findings: No erythema or rash.  Neurological:     Mental Status: She is oriented to person, place, and time.     Sensory: No sensory deficit.     Motor: No weakness.     Coordination: Coordination normal.     UC Treatments / Results  Labs (all labs ordered are listed, but only abnormal results are displayed) Labs Reviewed - No data to display  EKG   Radiology No results found.  Procedures Procedures (including critical care time)  Medications Ordered in UC Medications - No data to display  Initial Impression / Assessment and Plan / UC Course  I have reviewed the triage vital signs and the nursing notes.  Pertinent labs & imaging results that were available during my care of the patient were reviewed by me and considered in my medical decision making (see chart for details).     New. Appears MSK and not inflammatory or gout related.  Treating with muscle relaxer and Mobic as she states that her glucose  values have been a bit uncontrolled chronically. Pt requests sling. Discussed ortho follow up and continue OTC topicals and gentle massage.  Final Clinical Impressions(s) / UC Diagnoses   Final diagnoses:  Acute pain of left shoulder   Discharge Instructions   None    ED Prescriptions  Medication Sig Dispense Auth. Provider   methocarbamol (ROBAXIN) 500 MG tablet Take 1 tablet (500 mg total) by mouth 2 (two) times daily. 20 tablet Ricci Paff M, PA-C   meloxicam (MOBIC) 15 MG tablet Take 1 tablet (15 mg total) by mouth daily. 14 tablet Hughie Closs, [place]      PDMP not reviewed this encounter.   Hughie Closs, [place] 11/11/21 1555

## 2021-11-11 NOTE — ED Triage Notes (Signed)
Pt c/o left shoulder pain x 3 days. Her range of motion is limited. Pt denies heavy lifting or injury. Pt denies chest pain or SOB.

## 2021-11-15 MED ORDER — LISINOPRIL 20 MG-HYDROCHLOROTHIAZIDE 25 MG TABLET
ORAL_TABLET | Freq: Every day | ORAL | 3 refills | 90.00000 days | Status: CP
Start: 2021-11-15 — End: ?

## 2021-11-27 ENCOUNTER — Ambulatory Visit: Admit: 2021-11-27 | Discharge: 2021-11-27 | Payer: PRIVATE HEALTH INSURANCE

## 2021-11-27 DIAGNOSIS — E113391 Type 2 diabetes mellitus with moderate nonproliferative diabetic retinopathy without macular edema, right eye: Principal | ICD-10-CM

## 2021-11-27 DIAGNOSIS — H524 Presbyopia: Principal | ICD-10-CM

## 2021-11-27 DIAGNOSIS — E113592 Type 2 diabetes mellitus with proliferative diabetic retinopathy without macular edema, left eye: Principal | ICD-10-CM

## 2021-11-27 DIAGNOSIS — H43393 Other vitreous opacities, bilateral: Principal | ICD-10-CM

## 2021-11-27 DIAGNOSIS — H269 Unspecified cataract: Principal | ICD-10-CM

## 2021-11-28 ENCOUNTER — Ambulatory Visit: Admit: 2021-11-28 | Discharge: 2021-11-29 | Payer: PRIVATE HEALTH INSURANCE

## 2021-11-28 DIAGNOSIS — I10 Essential (primary) hypertension: Principal | ICD-10-CM

## 2021-11-28 DIAGNOSIS — E1142 Type 2 diabetes mellitus with diabetic polyneuropathy: Principal | ICD-10-CM

## 2021-11-28 DIAGNOSIS — Z794 Long term (current) use of insulin: Principal | ICD-10-CM

## 2021-11-28 DIAGNOSIS — B379 Candidiasis, unspecified: Principal | ICD-10-CM

## 2021-11-28 MED ORDER — FLUCONAZOLE 150 MG TABLET
ORAL_TABLET | 0 refills | 0 days | Status: CP
Start: 2021-11-28 — End: ?

## 2021-11-28 MED ORDER — SEMAGLUTIDE 1 MG/DOSE (4 MG/3 ML) SUBCUTANEOUS PEN INJECTOR
SUBCUTANEOUS | prn refills | 84 days | Status: CP
Start: 2021-11-28 — End: ?

## 2021-11-29 MED ORDER — METOPROLOL TARTRATE 50 MG TABLET
ORAL_TABLET | Freq: Two times a day (BID) | ORAL | 3 refills | 90 days | Status: CP
Start: 2021-11-29 — End: 2022-02-17

## 2021-12-06 ENCOUNTER — Ambulatory Visit: Admit: 2021-12-06 | Discharge: 2021-12-06 | Payer: PRIVATE HEALTH INSURANCE

## 2021-12-06 DIAGNOSIS — E113551 Type 2 diabetes mellitus with stable proliferative diabetic retinopathy, right eye: Principal | ICD-10-CM

## 2021-12-17 DIAGNOSIS — I10 Essential (primary) hypertension: Principal | ICD-10-CM

## 2022-02-18 ENCOUNTER — Ambulatory Visit
Admit: 2022-02-18 | Discharge: 2022-02-18 | Payer: PRIVATE HEALTH INSURANCE | Attending: Internal Medicine | Primary: Internal Medicine

## 2022-02-18 ENCOUNTER — Ambulatory Visit: Admit: 2022-02-18 | Discharge: 2022-02-18 | Payer: PRIVATE HEALTH INSURANCE

## 2022-02-18 DIAGNOSIS — Z794 Long term (current) use of insulin: Principal | ICD-10-CM

## 2022-02-18 DIAGNOSIS — E1142 Type 2 diabetes mellitus with diabetic polyneuropathy: Principal | ICD-10-CM

## 2022-02-18 DIAGNOSIS — F17200 Nicotine dependence, unspecified, uncomplicated: Principal | ICD-10-CM

## 2022-02-18 DIAGNOSIS — Z6841 Body Mass Index (BMI) 40.0 and over, adult: Principal | ICD-10-CM

## 2022-02-18 DIAGNOSIS — I10 Essential (primary) hypertension: Principal | ICD-10-CM

## 2022-02-18 MED ORDER — DULOXETINE 30 MG CAPSULE,DELAYED RELEASE
ORAL_CAPSULE | Freq: Every day | ORAL | 1 refills | 90 days | Status: CP
Start: 2022-02-18 — End: ?

## 2022-02-18 MED ORDER — AMLODIPINE 5 MG TABLET
ORAL_TABLET | Freq: Every day | ORAL | 11 refills | 30 days | Status: CP
Start: 2022-02-18 — End: 2023-02-18

## 2022-02-18 MED ORDER — METFORMIN ER 500 MG TABLET,EXTENDED RELEASE 24 HR
ORAL_TABLET | Freq: Every day | ORAL | 3 refills | 22 days | Status: CP
Start: 2022-02-18 — End: 2023-02-18

## 2022-02-18 MED ORDER — OZEMPIC 2 MG/DOSE (8 MG/3 ML) SUBCUTANEOUS PEN INJECTOR
SUBCUTANEOUS | 2 refills | 28 days | Status: CP
Start: 2022-02-18 — End: ?

## 2022-02-19 MED ORDER — GABAPENTIN 300 MG CAPSULE
ORAL_CAPSULE | Freq: Three times a day (TID) | ORAL | 3 refills | 90 days | Status: CP
Start: 2022-02-19 — End: 2023-02-20

## 2022-02-26 ENCOUNTER — Ambulatory Visit
Admit: 2022-02-26 | Discharge: 2022-02-27 | Payer: PRIVATE HEALTH INSURANCE | Attending: Student in an Organized Health Care Education/Training Program | Primary: Student in an Organized Health Care Education/Training Program

## 2022-02-26 DIAGNOSIS — Z794 Long term (current) use of insulin: Principal | ICD-10-CM

## 2022-02-26 DIAGNOSIS — E1142 Type 2 diabetes mellitus with diabetic polyneuropathy: Principal | ICD-10-CM

## 2022-02-26 MED ORDER — DEXAMETHASONE 2 MG TABLET
ORAL_TABLET | Freq: Every evening | ORAL | 0 refills | 1 days | Status: CP
Start: 2022-02-26 — End: ?

## 2022-02-27 ENCOUNTER — Ambulatory Visit: Admit: 2022-02-27 | Payer: PRIVATE HEALTH INSURANCE

## 2022-03-06 ENCOUNTER — Ambulatory Visit: Admit: 2022-03-06 | Discharge: 2022-03-07 | Payer: PRIVATE HEALTH INSURANCE

## 2022-03-06 ENCOUNTER — Ambulatory Visit: Admit: 2022-03-06 | Discharge: 2022-03-07 | Payer: PRIVATE HEALTH INSURANCE | Attending: Family | Primary: Family

## 2022-03-06 DIAGNOSIS — H43393 Other vitreous opacities, bilateral: Principal | ICD-10-CM

## 2022-03-06 DIAGNOSIS — E113551 Type 2 diabetes mellitus with stable proliferative diabetic retinopathy, right eye: Principal | ICD-10-CM

## 2022-03-06 DIAGNOSIS — Z794 Long term (current) use of insulin: Principal | ICD-10-CM

## 2022-03-06 DIAGNOSIS — E1142 Type 2 diabetes mellitus with diabetic polyneuropathy: Principal | ICD-10-CM

## 2022-03-06 DIAGNOSIS — H269 Unspecified cataract: Principal | ICD-10-CM

## 2022-04-03 ENCOUNTER — Ambulatory Visit: Admit: 2022-04-03 | Discharge: 2022-04-04 | Payer: PRIVATE HEALTH INSURANCE

## 2022-04-09 ENCOUNTER — Ambulatory Visit
Admit: 2022-04-09 | Discharge: 2022-04-10 | Payer: PRIVATE HEALTH INSURANCE | Attending: Internal Medicine | Primary: Internal Medicine

## 2022-04-09 DIAGNOSIS — R0683 Snoring: Principal | ICD-10-CM

## 2022-04-09 DIAGNOSIS — C541 Malignant neoplasm of endometrium: Principal | ICD-10-CM

## 2022-04-09 DIAGNOSIS — Z6841 Body Mass Index (BMI) 40.0 and over, adult: Principal | ICD-10-CM

## 2022-04-09 DIAGNOSIS — E1142 Type 2 diabetes mellitus with diabetic polyneuropathy: Principal | ICD-10-CM

## 2022-04-09 DIAGNOSIS — Z1211 Encounter for screening for malignant neoplasm of colon: Principal | ICD-10-CM

## 2022-04-09 DIAGNOSIS — Z794 Long term (current) use of insulin: Principal | ICD-10-CM

## 2022-04-09 MED ORDER — WEGOVY 2.4 MG/0.75 ML SUBCUTANEOUS PEN INJECTOR
SUBCUTANEOUS | 2 refills | 0 days | Status: CP
Start: 2022-04-09 — End: ?

## 2022-04-10 DIAGNOSIS — C541 Malignant neoplasm of endometrium: Principal | ICD-10-CM

## 2022-05-07 ENCOUNTER — Ambulatory Visit
Admission: EM | Admit: 2022-05-07 | Discharge: 2022-05-07 | Disposition: A | Discharge status: Home / Self Care | Payer: 59 | Attending: Family Medicine | Admitting: Family Medicine

## 2022-05-07 ENCOUNTER — Ambulatory Visit: Payer: 59

## 2022-05-07 ENCOUNTER — Ambulatory Visit (INDEPENDENT_AMBULATORY_CARE_PROVIDER_SITE_OTHER): Payer: 59

## 2022-05-07 DIAGNOSIS — M25511 Pain in right shoulder: Secondary | ICD-10-CM

## 2022-05-07 DIAGNOSIS — M5412 Radiculopathy, cervical region: Secondary | ICD-10-CM | POA: Diagnosis not present

## 2022-05-07 MED ORDER — DEXAMETHASONE SODIUM PHOSPHATE 10 MG/ML IJ SOLN
5.0000 mg | Freq: Once | INTRAMUSCULAR | Status: AC
  Administered 2022-05-07: 5 mg via INTRAMUSCULAR

## 2022-05-07 MED ORDER — CYCLOBENZAPRINE HCL 10 MG PO TABS: 10.0000 mg | ORAL_TABLET | Freq: Two times a day (BID) | ORAL | 0 refills | Status: AC | PRN

## 2022-05-07 MED ORDER — IBUPROFEN 800 MG PO TABS: 800.0000 mg | ORAL_TABLET | Freq: Two times a day (BID) | ORAL | 0 refills | Status: AC | PRN

## 2022-05-07 MED ORDER — KETOROLAC TROMETHAMINE 30 MG/ML IJ SOLN
30.0000 mg | Freq: Once | INTRAMUSCULAR | Status: AC
  Administered 2022-05-07: 30 mg via INTRAMUSCULAR

## 2022-05-07 NOTE — Discharge Instructions (Addendum)
Right Shoulder x-ray shows mild arthritis. This is likely an acute flare-up.  You have been given a Toradol and steroid injection. Do not take any Ibuprofen before 1 am. You can start your muscle relaxer.  Keep in mind this medication can cause severe drowsiness. Monitor your blood sugar if you have been given a low-dose of a steroid injection, it may be slightly elevated temporarily, however after 24 hours should return to your normal baseline.

## 2022-05-07 NOTE — ED Provider Notes (Signed)
[name]    CSN: [id number] Arrival date & time: 05/07/22  1432      History   Chief Complaint No chief complaint on file.   HPI Sherri Escobar is a 51 y.o. female.   HPI Patient presents for evaluation of right shoulder pain that radiates from mid neck and upper right clavicle. Denies any known injury. She works at [employer] from 8-10 hours per day. She has experienced similar pain in left side shoulder pain. She has been taking Ibuprofen 800 mg for acute pain. Denies any numbness or tingling in arms and fingers. Past Medical History:  Diagnosis Date  . Diabetes mellitus without complication (HCC)   . High cholesterol   . Hypertension     There are no problems to display for this patient.   Past Surgical History:  Procedure Laterality Date  . LIPOMA RESECTION      OB History   No obstetric history on file.      Home Medications    Prior to Admission medications   Medication Sig Start Date End Date Taking? Authorizing Provider  cyclobenzaprine (FLEXERIL) 10 MG tablet Take 1 tablet (10 mg total) by mouth 2 (two) times daily as needed for muscle spasms. 05/07/22  Yes Bing Neighbors, FNP  ibuprofen (ADVIL) 800 MG tablet Take 1 tablet (800 mg total) by mouth every 12 (twelve) hours as needed for moderate pain. 05/07/22  Yes Bing Neighbors, FNP  amoxicillin (AMOXIL) 500 MG tablet Take 1 tablet (500 mg total) by mouth 3 (three) times daily. 05/25/15   Ignacia Bayley, PA-C  DULoxetine (CYMBALTA) 30 MG capsule Take 90 mg by mouth daily. 11/03/21   [provider]  gabapentin (NEURONTIN) 300 MG capsule Take 300 mg by mouth 3 (three) times daily. 11/03/21   [provider]  HUMALOG KWIKPEN 100 UNIT/ML KwikPen SMARTSIG:0-50 Unit(s) SUB-Q 3 Times Daily 10/16/21   [provider]  hydrOXYzine (ATARAX) 50 MG tablet hydroxyzine HCl 50 mg tablet    [provider]  LANTUS SOLOSTAR 100 UNIT/ML Solostar Pen SMARTSIG:50 Unit(s)  SUB-Q Twice Daily 11/03/21   [provider]  lisinopril-hydrochlorothiazide (PRINZIDE,ZESTORETIC) 20-25 MG tablet Take 1 tablet by mouth 2 (two) times daily.    [provider]  meclizine (ANTIVERT) 25 MG tablet Take 1 tablet (25 mg total) by mouth 3 (three) times daily as needed for dizziness. 04/25/19   Jeanmarie Plant, MD  meloxicam (MOBIC) 15 MG tablet Take 1 tablet (15 mg total) by mouth daily. 11/11/21   Rushie Chestnut, PA-C  metFORMIN (GLUCOPHAGE) 500 MG tablet Take 500 mg by mouth 2 (two) times daily. 10/08/21   [provider]  methocarbamol (ROBAXIN) 500 MG tablet Take 1 tablet (500 mg total) by mouth 2 (two) times daily. 11/11/21   Rushie Chestnut, PA-C  METOPROLOL TARTRATE PO Take by mouth 2 (two) times daily.    [provider]  OZEMPIC, 0.25 OR 0.5 MG/DOSE, 2 MG/1.5ML SOPN Inject into the skin. 11/07/21   [provider]  spironolactone (ALDACTONE) 50 MG tablet Take 50 mg by mouth daily. 10/08/21   [provider]    Family History No family history on file.  Social History Social History   Tobacco Use  . Smoking status: Every Day    Packs/day: 0.50    Types: Cigarettes  . Smokeless tobacco: Never  Vaping Use  . Vaping Use: Never used  Substance Use Topics  . Alcohol use: No  .  Drug use: Never     Allergies   Cashew nut oil, Empagliflozin, Justicia adhatoda (malabar nut tree) [justicia adhatoda], Statins, and Zofran [ondansetron hcl]   Review of Systems Review of Systems Pertinent negatives listed in HPI   Physical Exam Triage Vital Signs ED Triage Vitals [05/07/22 1528]  Enc Vitals Group     BP (!) 155/79     Pulse Rate (!) 110     Resp 18     Temp 98.8 F (37.1 C)     Temp Source Oral     SpO2 96 %     Weight      Height      Head Circumference      Peak Flow      Pain Score      Pain Loc      Pain Edu?      Excl. in GC?    No data found.  Updated Vital Signs BP (!) 155/79 (BP Location:  Left Arm)   Pulse (!) 110   Temp 98.8 F (37.1 C) (Oral)   Resp 18   LMP 09/15/2016 (Within Weeks)   SpO2 96%   Visual Acuity Right Eye Distance:   Left Eye Distance:   Bilateral Distance:    Right Eye Near:   Left Eye Near:    Bilateral Near:     Physical Exam Constitutional:      Appearance: She is obese.  HENT:     Head: Normocephalic and atraumatic.  Eyes:     Extraocular Movements: Extraocular movements intact.     Pupils: Pupils are equal, round, and reactive to light.  Cardiovascular:     Rate and Rhythm: Normal rate and regular rhythm.  Pulmonary:     Effort: Pulmonary effort is normal.     Breath sounds: Normal breath sounds.  Musculoskeletal:       Arms:     Cervical back: Normal range of motion and neck supple. Pain with movement present.  Skin:    General: Skin is warm and dry.     Capillary Refill: Capillary refill takes less than 2 seconds.  Neurological:     General: No focal deficit present.     Mental Status: She is alert and oriented to person, place, and time.  Psychiatric:        Mood and Affect: Mood normal.        Behavior: Behavior normal.        Thought Content: Thought content normal.        Judgment: Judgment normal.     UC Treatments / Results  Labs (all labs ordered are listed, but only abnormal results are displayed) Labs Reviewed - No data to display  EKG   Radiology DG Shoulder Right  Result Date: 05/07/2022 CLINICAL DATA:  Right shoulder pain EXAM: RIGHT SHOULDER - 2+ VIEW COMPARISON:  None Available. FINDINGS: There is no evidence of fracture or dislocation. 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 Soft tissues are unremarkable. IMPRESSION: No acute fracture or dislocation. Mild-to-moderate degenerative changes. Electronically Signed   By: Duanne Guess D.O.   On: 05/07/2022 15:55    Procedures Procedures (including critical care time)  Medications Ordered in UC Medications  ketorolac  (TORADOL) 30 MG/ML injection 30 mg (30 mg Intramuscular Given 05/07/22 1606)  dexamethasone (DECADRON) injection 5 mg (5 mg Intramuscular Given 05/07/22 1606)    Initial Impression / Assessment and Plan / UC Course  I have reviewed the triage vital signs and the  nursing notes.  Pertinent labs & imaging results that were available during my care of the patient were reviewed by me and considered in my medical decision making (see chart for details).    Acute right shoulder with acute cervical radiculopathy  Refilled Ibuprofen and prescribed cyclobenzaprine PRN back pain. IM Decadron and Toradol given Follow-up if symptoms worsen or do not improve. Final Clinical Impressions(s) / UC Diagnoses   Final diagnoses:  Acute pain of right shoulder  Acute cervical radiculopathy     Discharge Instructions      Right Shoulder x-ray shows mild arthritis. This is likely an acute flare-up.  You have been given a Toradol and steroid injection. Do not take any Ibuprofen before 1 am. You can start your muscle relaxer.  Keep in mind this medication can cause severe drowsiness. Monitor your blood sugar if you have been given a low-dose of a steroid injection, it may be slightly elevated temporarily, however after 24 hours should return to your normal baseline.      ED Prescriptions     Medication Sig Dispense Auth. Provider   cyclobenzaprine (FLEXERIL) 10 MG tablet Take 1 tablet (10 mg total) by mouth 2 (two) times daily as needed for muscle spasms. 20 tablet Bing Neighbors, FNP   ibuprofen (ADVIL) 800 MG tablet Take 1 tablet (800 mg total) by mouth every 12 (twelve) hours as needed for moderate pain. 30 tablet Bing Neighbors, FNP      PDMP not reviewed this encounter.   Bing Neighbors, FNP 05/07/22 [phone]

## 2022-05-08 MED ORDER — IBUPROFEN 800 MG TABLET
ORAL_TABLET | 2 refills | 0 days | Status: CP
Start: 2022-05-08 — End: ?

## 2022-05-09 MED ORDER — MOUNJARO 5 MG/0.5 ML SUBCUTANEOUS PEN INJECTOR
0 refills | 0 days | Status: CP
Start: 2022-05-09 — End: ?

## 2022-05-11 MED ORDER — MOUNJARO 5 MG/0.5 ML SUBCUTANEOUS PEN INJECTOR
SUBCUTANEOUS | 0 refills | 30 days | Status: CP
Start: 2022-05-11 — End: 2022-06-10

## 2022-05-16 MED ORDER — INSULIN LISPRO (U-100) 100 UNIT/ML SUBCUTANEOUS PEN
3 refills | 0 days | Status: CP
Start: 2022-05-16 — End: ?

## 2022-06-10 DIAGNOSIS — B379 Candidiasis, unspecified: Principal | ICD-10-CM

## 2022-06-10 MED ORDER — FLUCONAZOLE 150 MG TABLET
ORAL_TABLET | 0 refills | 0 days | Status: CP
Start: 2022-06-10 — End: ?

## 2022-06-10 MED ORDER — MOUNJARO 5 MG/0.5 ML SUBCUTANEOUS PEN INJECTOR
SUBCUTANEOUS | 1 refills | 0 days | Status: CP
Start: 2022-06-10 — End: ?

## 2022-06-21 ENCOUNTER — Ambulatory Visit: Admit: 2022-06-21 | Payer: PRIVATE HEALTH INSURANCE

## 2022-08-27 MED ORDER — MOUNJARO 5 MG/0.5 ML SUBCUTANEOUS PEN INJECTOR
SUBCUTANEOUS | 3 refills | 0 days | Status: CP
Start: 2022-08-27 — End: ?

## 2022-09-03 ENCOUNTER — Ambulatory Visit
Admit: 2022-09-03 | Discharge: 2022-09-04 | Payer: PRIVATE HEALTH INSURANCE | Attending: Internal Medicine | Primary: Internal Medicine

## 2022-09-03 DIAGNOSIS — I10 Essential (primary) hypertension: Principal | ICD-10-CM

## 2022-09-03 DIAGNOSIS — E876 Hypokalemia: Principal | ICD-10-CM

## 2022-09-03 DIAGNOSIS — Z6841 Body Mass Index (BMI) 40.0 and over, adult: Principal | ICD-10-CM

## 2022-09-03 DIAGNOSIS — R748 Abnormal levels of other serum enzymes: Principal | ICD-10-CM

## 2022-09-03 DIAGNOSIS — Z794 Long term (current) use of insulin: Principal | ICD-10-CM

## 2022-09-03 DIAGNOSIS — E1142 Type 2 diabetes mellitus with diabetic polyneuropathy: Principal | ICD-10-CM

## 2022-09-03 DIAGNOSIS — R809 Proteinuria, unspecified: Principal | ICD-10-CM

## 2022-09-03 DIAGNOSIS — F330 Major depressive disorder, recurrent, mild: Principal | ICD-10-CM

## 2022-09-03 DIAGNOSIS — L659 Nonscarring hair loss, unspecified: Principal | ICD-10-CM

## 2022-09-03 MED ORDER — DULOXETINE 60 MG CAPSULE,DELAYED RELEASE
ORAL_CAPSULE | Freq: Every day | ORAL | 3 refills | 90 days | Status: CP
Start: 2022-09-03 — End: 2023-08-29

## 2022-09-03 MED ORDER — INSULIN LISPRO (U-100) 100 UNIT/ML SUBCUTANEOUS PEN
3 refills | 0 days | Status: CP
Start: 2022-09-03 — End: ?

## 2022-09-16 DIAGNOSIS — E119 Type 2 diabetes mellitus without complications: Principal | ICD-10-CM

## 2022-09-16 MED ORDER — PEN NEEDLE, DIABETIC 32 GAUGE X 5/32" (4 MM)
Freq: Four times a day (QID) | 4 refills | 50 days | Status: CP
Start: 2022-09-16 — End: ?

## 2022-10-03 ENCOUNTER — Ambulatory Visit (INDEPENDENT_AMBULATORY_CARE_PROVIDER_SITE_OTHER): Payer: 59

## 2022-10-03 ENCOUNTER — Ambulatory Visit
Admission: EM | Admit: 2022-10-03 | Discharge: 2022-10-03 | Disposition: A | Discharge status: Home / Self Care | Payer: 59

## 2022-10-03 DIAGNOSIS — R062 Wheezing: Secondary | ICD-10-CM | POA: Diagnosis not present

## 2022-10-03 DIAGNOSIS — J209 Acute bronchitis, unspecified: Secondary | ICD-10-CM

## 2022-10-03 DIAGNOSIS — R0602 Shortness of breath: Secondary | ICD-10-CM | POA: Diagnosis not present

## 2022-10-03 DIAGNOSIS — J111 Influenza due to unidentified influenza virus with other respiratory manifestations: Secondary | ICD-10-CM | POA: Diagnosis not present

## 2022-10-03 MED ORDER — BENZONATATE 100 MG PO CAPS: ORAL_CAPSULE | ORAL | 0 refills | Status: AC

## 2022-10-03 MED ORDER — ALBUTEROL SULFATE (2.5 MG/3ML) 0.083% IN NEBU
2.5000 mg | INHALATION_SOLUTION | Freq: Once | RESPIRATORY_TRACT | Status: AC
  Administered 2022-10-03: 2.5 mg via RESPIRATORY_TRACT

## 2022-10-03 MED ORDER — FLUTICASONE PROPIONATE HFA 110 MCG/ACT IN AERO: 2.0000 | INHALATION_SPRAY | Freq: Two times a day (BID) | RESPIRATORY_TRACT | 12 refills | Status: AC

## 2022-10-03 MED ORDER — ALBUTEROL SULFATE HFA 108 (90 BASE) MCG/ACT IN AERS: 2.0000 | INHALATION_SPRAY | Freq: Four times a day (QID) | RESPIRATORY_TRACT | 0 refills | Status: AC | PRN

## 2022-10-03 NOTE — ED Provider Notes (Signed)
[name]    CSN: [id number] Arrival date & time: 10/03/22  1556      History   Chief Complaint Chief Complaint  Patient presents with   Shortness of Breath   Influenza   Fever    HPI Eugene N Brightwell is a 51 y.o. female.    Shortness of Breath Associated symptoms: fever   Influenza Presenting symptoms: fever and shortness of breath   Fever   Patient presents to urgent care complaining of cough, nasal congestion, shortness of breath.  She endorses symptoms starting on Monday (3 days ago) and reports positive influenza diagnosis 2 days ago.  She reports a fever, chills, myalgias.  Endorses history of childhood asthma with no recent need for treatment or exacerbations.  PMH includes diabetes treated with metformin and Ozempic and prandial and basal insulin.  PMH also includes hypertension treated with lisinopril-hydrochlorothiazide and metoprolol.   She endorses cigarette smoking, approximately 1/2 pack/day.  Past Medical History:  Diagnosis Date   Diabetes mellitus without complication (HCC)    High cholesterol    Hypertension     There are no problems to display for this patient.   Past Surgical History:  Procedure Laterality Date   LIPOMA RESECTION      OB History   No obstetric history on file.      Home Medications    Prior to Admission medications   Medication Sig Start Date End Date Taking? Authorizing Provider  amoxicillin (AMOXIL) 500 MG tablet Take 1 tablet (500 mg total) by mouth 3 (three) times daily. 05/25/15   Ignacia Bayley, PA-C  cyclobenzaprine (FLEXERIL) 10 MG tablet Take 1 tablet (10 mg total) by mouth 2 (two) times daily as needed for muscle spasms. 05/07/22   Bing Neighbors, FNP  DULoxetine (CYMBALTA) 30 MG capsule Take 90 mg by mouth daily. 11/03/21   [provider]  gabapentin (NEURONTIN) 300 MG capsule Take 300 mg by mouth 3 (three) times daily. 11/03/21   [provider]  HUMALOG KWIKPEN 100  UNIT/ML KwikPen SMARTSIG:0-50 Unit(s) SUB-Q 3 Times Daily 10/16/21   [provider]  hydrOXYzine (ATARAX) 50 MG tablet hydroxyzine HCl 50 mg tablet    [provider]  ibuprofen (ADVIL) 800 MG tablet Take 1 tablet (800 mg total) by mouth every 12 (twelve) hours as needed for moderate pain. 05/07/22   Bing Neighbors, FNP  LANTUS SOLOSTAR 100 UNIT/ML Solostar Pen SMARTSIG:50 Unit(s) SUB-Q Twice Daily 11/03/21   [provider]  lisinopril-hydrochlorothiazide (PRINZIDE,ZESTORETIC) 20-25 MG tablet Take 1 tablet by mouth 2 (two) times daily.    [provider]  meclizine (ANTIVERT) 25 MG tablet Take 1 tablet (25 mg total) by mouth 3 (three) times daily as needed for dizziness. 04/25/19   Jeanmarie Plant, MD  meloxicam (MOBIC) 15 MG tablet Take 1 tablet (15 mg total) by mouth daily. 11/11/21   Rushie Chestnut, PA-C  metFORMIN (GLUCOPHAGE) 500 MG tablet Take 500 mg by mouth 2 (two) times daily. 10/08/21   [provider]  methocarbamol (ROBAXIN) 500 MG tablet Take 1 tablet (500 mg total) by mouth 2 (two) times daily. 11/11/21   Rushie Chestnut, PA-C  METOPROLOL TARTRATE PO Take by mouth 2 (two) times daily.    [provider]  OZEMPIC, 0.25 OR 0.5 MG/DOSE, 2 MG/1.5ML SOPN Inject into the skin. 11/07/21   [provider]  spironolactone (ALDACTONE) 50 MG tablet Take 50 mg by mouth daily. 10/08/21   [provider]  Family History No family history on file.  Social History Social History   Tobacco Use   Smoking status: Every Day    Packs/day: 0.50    Types: Cigarettes   Smokeless tobacco: Never  Vaping Use   Vaping Use: Never used  Substance Use Topics   Alcohol use: No   Drug use: Never     Allergies   Cashew nut oil, Empagliflozin, Justicia adhatoda (malabar nut tree) [justicia adhatoda], Statins, and Zofran [ondansetron hcl]   Review of Systems Review of Systems  Constitutional:  Positive for fever.   Respiratory:  Positive for shortness of breath.      Physical Exam Triage Vital Signs ED Triage Vitals [10/03/22 1603]  Enc Vitals Group     BP      Pulse      Resp      Temp      Temp src      SpO2      Weight      Height      Head Circumference      Peak Flow      Pain Score 0     Pain Loc      Pain Edu?      Excl. in GC?    No data found.  Updated Vital Signs LMP 09/15/2016 (Within Weeks)   Visual Acuity Right Eye Distance:   Left Eye Distance:   Bilateral Distance:    Right Eye Near:   Left Eye Near:    Bilateral Near:     Physical Exam Vitals reviewed.  Constitutional:      Appearance: She is well-developed. She is obese.  Cardiovascular:     Rate and Rhythm: Normal rate and regular rhythm.  Pulmonary:     Effort: Pulmonary effort is normal.     Breath sounds: Wheezing present.  Skin:    General: Skin is warm and dry.  Neurological:     General: No focal deficit present.     Mental Status: She is alert and oriented to person, place, and time.  Psychiatric:        Mood and Affect: Mood normal.        Behavior: Behavior normal.      UC Treatments / Results  Labs (all labs ordered are listed, but only abnormal results are displayed) Labs Reviewed - No data to display  EKG   Radiology No results found.  Procedures Procedures (including critical care time)  Medications Ordered in UC Medications - No data to display  Initial Impression / Assessment and Plan / UC Course  I have reviewed the triage vital signs and the nursing notes.  Pertinent labs & imaging results that were available during my care of the patient were reviewed by me and considered in my medical decision making (see chart for details).  Patient is Febrile here With recent antipyretics. Satting reasonably well (95%) on room air at rest. Overall is ill appearing, non-toxic. She appears well hydrated and without respiratory distress at rest. She experienced halting speech  while standing at registration after arrival - resolved while seated in exam room. Pulmonary exam is remarkable for wheezing in all lobes.  Administering albuterol via nebulizer which was largely ineffective. Avoiding PO corticosteroid treatment d/t DM2 inadequately controlled. Will prescribe flovent and ask her to f/up with her PCP regarding need for on-going management.   Final Clinical Impressions(s) / UC Diagnoses   Final diagnoses:  None   Discharge Instructions   None  ED Prescriptions   None    PDMP not reviewed this encounter.   Charma Igo, [place] 10/03/22 1709

## 2022-10-03 NOTE — Discharge Instructions (Addendum)
Please establish care with a primary care provider so you can be evaluated for possible need of asthma management after the current symptoms resolve.

## 2022-10-03 NOTE — ED Triage Notes (Signed)
Pt. States she tested positive for the FLU after having a fever for two days. Pt. Presents to today w/ c/o of a cough and congestion and SOB.

## 2022-10-04 ENCOUNTER — Telehealth
Admit: 2022-10-04 | Discharge: 2022-10-05 | Payer: PRIVATE HEALTH INSURANCE | Attending: Internal Medicine | Primary: Internal Medicine

## 2022-10-04 DIAGNOSIS — F17200 Nicotine dependence, unspecified, uncomplicated: Principal | ICD-10-CM

## 2022-10-04 DIAGNOSIS — Z6841 Body Mass Index (BMI) 40.0 and over, adult: Principal | ICD-10-CM

## 2022-10-04 DIAGNOSIS — I10 Essential (primary) hypertension: Principal | ICD-10-CM

## 2022-10-04 DIAGNOSIS — B379 Candidiasis, unspecified: Principal | ICD-10-CM

## 2022-10-04 DIAGNOSIS — J101 Influenza due to other identified influenza virus with other respiratory manifestations: Principal | ICD-10-CM

## 2022-10-04 DIAGNOSIS — Z8709 Personal history of other diseases of the respiratory system: Principal | ICD-10-CM

## 2022-10-04 MED ORDER — FLUCONAZOLE 150 MG TABLET
ORAL_TABLET | 0 refills | 0 days | Status: CP
Start: 2022-10-04 — End: ?

## 2022-10-04 MED ORDER — OSELTAMIVIR 75 MG CAPSULE
ORAL_CAPSULE | Freq: Two times a day (BID) | ORAL | 0 refills | 5 days | Status: CP
Start: 2022-10-04 — End: 2022-10-09

## 2022-10-04 MED ORDER — FLUTICASONE PROPIONATE 110 MCG/ACTUATION HFA AEROSOL INHALER
Freq: Every day | RESPIRATORY_TRACT | 0 refills | 0 days | Status: CP
Start: 2022-10-04 — End: ?

## 2022-10-08 ENCOUNTER — Ambulatory Visit (INDEPENDENT_AMBULATORY_CARE_PROVIDER_SITE_OTHER): Payer: 59

## 2022-10-08 ENCOUNTER — Ambulatory Visit (HOSPITAL_COMMUNITY)
Admission: EM | Admit: 2022-10-08 | Discharge: 2022-10-08 | Disposition: A | Discharge status: Home / Self Care | Payer: 59 | Attending: Internal Medicine | Admitting: Internal Medicine

## 2022-10-08 DIAGNOSIS — J209 Acute bronchitis, unspecified: Secondary | ICD-10-CM

## 2022-10-08 DIAGNOSIS — R062 Wheezing: Secondary | ICD-10-CM | POA: Diagnosis not present

## 2022-10-08 DIAGNOSIS — R0602 Shortness of breath: Secondary | ICD-10-CM

## 2022-10-08 DIAGNOSIS — J111 Influenza due to unidentified influenza virus with other respiratory manifestations: Secondary | ICD-10-CM | POA: Diagnosis not present

## 2022-10-08 DIAGNOSIS — R051 Acute cough: Secondary | ICD-10-CM

## 2022-10-08 LAB — CBG MONITORING, ED: Glucose-Capillary: 90 mg/dL (ref 70–99)

## 2022-10-08 MED ORDER — PROMETHAZINE-DM 6.25-15 MG/5ML PO SYRP: 5.0000 mL | ORAL_SOLUTION | Freq: Every evening | ORAL | 0 refills | Status: AC | PRN

## 2022-10-08 MED ORDER — IPRATROPIUM-ALBUTEROL 0.5-2.5 (3) MG/3ML IN SOLN
RESPIRATORY_TRACT | Status: AC
  Filled 2022-10-08: qty 3

## 2022-10-08 MED ORDER — METHYLPREDNISOLONE SODIUM SUCC 125 MG IJ SOLR
INTRAMUSCULAR | Status: AC
  Filled 2022-10-08: qty 4

## 2022-10-08 MED ORDER — ALBUTEROL SULFATE (2.5 MG/3ML) 0.083% IN NEBU
2.5000 mg | INHALATION_SOLUTION | Freq: Once | RESPIRATORY_TRACT | Status: AC
  Administered 2022-10-08: 2.5 mg via RESPIRATORY_TRACT

## 2022-10-08 MED ORDER — IPRATROPIUM-ALBUTEROL 0.5-2.5 (3) MG/3ML IN SOLN
3.0000 mL | Freq: Once | RESPIRATORY_TRACT | Status: AC
  Administered 2022-10-08: 3 mL via RESPIRATORY_TRACT

## 2022-10-08 MED ORDER — ALBUTEROL SULFATE (2.5 MG/3ML) 0.083% IN NEBU
INHALATION_SOLUTION | RESPIRATORY_TRACT | Status: AC
  Filled 2022-10-08: qty 3

## 2022-10-08 MED ORDER — PREDNISONE 20 MG PO TABS: 20.0000 mg | ORAL_TABLET | Freq: Every day | ORAL | 0 refills | Status: AC

## 2022-10-08 MED ORDER — ACETAMINOPHEN 325 MG PO TABS
975.0000 mg | ORAL_TABLET | Freq: Once | ORAL | Status: AC
  Administered 2022-10-08: 975 mg via ORAL

## 2022-10-08 MED ORDER — ACETAMINOPHEN 325 MG PO TABS
ORAL_TABLET | ORAL | Status: AC
  Filled 2022-10-08: qty 3

## 2022-10-08 MED ORDER — METHYLPREDNISOLONE SODIUM SUCC 125 MG IJ SOLR
80.0000 mg | Freq: Once | INTRAMUSCULAR | Status: AC
  Administered 2022-10-08: 80 mg via INTRAMUSCULAR

## 2022-10-08 NOTE — ED Provider Notes (Signed)
[place] [facility]    CSN: [id number] Arrival date & time: 10/08/22  1719      History   Chief Complaint Chief Complaint  Patient presents with   Shortness of Breath   URI    HPI Sherri Escobar is a 51 y.o. female.   Patient presents urgent care for evaluation of shortness of breath, persistent cough, and worsening influenza symptoms since being diagnosed with the flu on October 03, 2022.  Symptoms originally started on Monday, September 30, 2022 (8 days ago).  Patient was tested for flu on the 30th that her provider appointment, sent in Tamiflu 2 days later, then picked up the Tamiflu the next day on Saturday, December 2.  She has been taking the Tamiflu but states this has not been helping very much with her symptoms.  She is coughing with intermittent wheezing.  Cough sounds wet but is nonproductive.  Significant nasal congestion, bilateral ear pain, and sore throat reported.  She has had a very decreased appetite over the last few days.  She is a diabetic and has been taking all of her medications as prescribed.  Reports increase water intake and attempt to stay well-hydrated but has been urinating very frequently due to increased water intake.  She has not been checking her blood sugars.  Denies nausea, vomiting, abdominal pain, dizziness, confusion, diarrhea, and vision changes.  Reports shortness of breath but denies chest pain, palpitations, leg swelling, weakness.  Using albuterol, Flovent, Tamiflu, and Tylenol/ibuprofen at home for symptoms.  Last dose of Tylenol/ibuprofen was earlier this morning.  Patient is a smoker but states she is attempting to quit.  The last time she smoked was 8 days ago when her symptoms started.  Denies history of chronic respiratory problems.  Her sister is sick with influenza right now as well.  Last dose of albuterol inhaler was shortly prior to arrival urgent care.  No recent antibiotic or steroid use.   Shortness of Breath URI   Past  Medical History:  Diagnosis Date   Diabetes mellitus without complication (HCC)    High cholesterol    Hypertension     There are no problems to display for this patient.   Past Surgical History:  Procedure Laterality Date   LIPOMA RESECTION      OB History   No obstetric history on file.      Home Medications    Prior to Admission medications   Medication Sig Start Date End Date Taking? Authorizing Provider  albuterol (VENTOLIN HFA) 108 (90 Base) MCG/ACT inhaler Inhale 2 puffs into the lungs every 6 (six) hours as needed for wheezing or shortness of breath. 10/03/22  Yes Immordino, Annie Main, FNP  amLODipine (NORVASC) 5 MG tablet Take 5 mg by mouth daily.   Yes [provider]  amoxicillin (AMOXIL) 500 MG tablet Take 1 tablet (500 mg total) by mouth 3 (three) times daily. 05/25/15  Yes Mortimer Fries, PA-C  atorvastatin (LIPITOR) 10 MG tablet Take 1 tablet by mouth daily. 09/04/20  Yes [provider]  BD PEN NEEDLE NANO U/F 32G X 4 MM MISC 1 each by Miscellaneous route four (4) times a day. 09/16/22  Yes [provider]  benzonatate (TESSALON) 100 MG capsule Take 1-2 tablets 3 times a day as needed for cough 10/03/22  Yes Immordino, Annie Main, FNP  ciprofloxacin (CIPRO) 500 MG tablet ciprofloxacin 500 mg tablet  1 tab by  mouth twice daily for 10 days 10/02/21  Yes [provider]  Continuous Blood Gluc Receiver (DEXCOM G6 RECEIVER) [place] Receiver 05/03/20  Yes [provider]  Continuous Blood Gluc Sensor (DEXCOM G6 SENSOR) MISC Dispense DexCom G6 sensors; Use 1 sensor q 10 days 05/03/20  Yes [provider]  Continuous Blood Gluc Sensor (DEXCOM G6 SENSOR) MISC USE 1 SENSOR EVERY 10 DAYS 07/15/20  Yes [provider]  Continuous Blood Gluc Transmit (DEXCOM G6 TRANSMITTER) MISC USE 1 EVERY 90 DAYS 09/07/20  Yes [provider]  cyclobenzaprine (FLEXERIL) 10 MG tablet Take 1 tablet (10 mg total) by  mouth 2 (two) times daily as needed for muscle spasms. 05/07/22  Yes Scot Jun, FNP  diazepam (VALIUM) 5 MG tablet  06/12/20  Yes [provider]  doxycycline (VIBRA-TABS) 100 MG tablet doxycycline hyclate 100 mg tablet   Yes [provider]  DULoxetine (CYMBALTA) 30 MG capsule Take 90 mg by mouth daily. 11/03/21  Yes [provider]  fluconazole (DIFLUCAN) 150 MG tablet Take 150 mg by mouth daily.   Yes [provider]  fluticasone (FLOVENT HFA) 110 MCG/ACT inhaler Inhale 2 puffs into the lungs in the morning and at bedtime. 10/03/22  Yes Immordino, Annie Main, FNP  gabapentin (NEURONTIN) 300 MG capsule Take 300 mg by mouth 3 (three) times daily. 11/03/21  Yes [provider]  HUMALOG KWIKPEN 100 UNIT/ML KwikPen SMARTSIG:0-50 Unit(s) SUB-Q 3 Times Daily 10/16/21  Yes [provider]  hydrOXYzine (ATARAX) 50 MG tablet hydroxyzine HCl 50 mg tablet   Yes [provider]  ibuprofen (ADVIL) 800 MG tablet Take 1 tablet (800 mg total) by mouth every 12 (twelve) hours as needed for moderate pain. 05/07/22  Yes Scot Jun, FNP  ketoconazole (NIZORAL) 2 % shampoo Use few time weekly in shower for scalp or when you get your hair done. Leave in a few minutes then wash out. 09/16/18  Yes [provider]  LANTUS SOLOSTAR 100 UNIT/ML Solostar Pen SMARTSIG:50 Unit(s) SUB-Q Twice Daily 11/03/21  Yes [provider]  lisinopril-hydrochlorothiazide (PRINZIDE,ZESTORETIC) 20-25 MG tablet Take 1 tablet by mouth 2 (two) times daily.   Yes [provider]  meclizine (ANTIVERT) 25 MG tablet Take 1 tablet (25 mg total) by mouth 3 (three) times daily as needed for dizziness. 04/25/19  Yes Schuyler Amor, MD  meloxicam (MOBIC) 15 MG tablet Take 1 tablet (15 mg total) by mouth daily. 11/11/21  Yes Covington, Sarah M, PA-C  metFORMIN (GLUCOPHAGE) 500 MG tablet Take 500 mg by mouth 2 (two) times daily. 10/08/21  Yes [provider]  methocarbamol (ROBAXIN) 500 MG tablet Take 1 tablet (500 mg total) by mouth 2 (two) times daily. 11/11/21  Yes Covington, Judson Roch M, PA-C  METOPROLOL TARTRATE PO Take by mouth 2 (two) times daily.   Yes [provider]  MOUNJARO 5 MG/0.5ML Pen SMARTSIG:5 Milligram(s) SUB-Q Once a Week 08/28/22  Yes [provider]  MOUNJARO 7.5 MG/0.5ML Pen Inject into the skin. 11/02/22 12/02/22 Yes [provider]  OZEMPIC, 0.25 OR 0.5 MG/DOSE, 2 MG/1.5ML SOPN Inject into the skin. 11/07/21  Yes [provider]  polyethylene glycol (MIRALAX / GLYCOLAX) 17 g packet Take by mouth. 03/29/16  Yes [provider]  predniSONE (DELTASONE) 20 MG tablet Take 1 tablet (20 mg total) by mouth daily for 5 days. 10/08/22 10/13/22 Yes Talbot Grumbling, FNP  promethazine-dextromethorphan (PROMETHAZINE-DM) 6.25-15 MG/5ML syrup Take 5 mLs by mouth at bedtime as needed for cough. 10/08/22  Yes Talbot Grumbling, FNP  spironolactone (ALDACTONE) 50 MG tablet Take 50 mg by mouth daily. 10/08/21  Yes [provider]  STATUS COVID-19/FLU A&B KIT TEST AS DIRECTED TODAY 10/02/22  Yes [provider]  triamcinolone cream (KENALOG) 0.1 % Apply topically. 06/12/20  Yes [provider]    Family History No family history on file.  Social History Social History   Tobacco Use   Smoking status: Every Day    Packs/day: 0.50    Types: Cigarettes   Smokeless tobacco: Never  Vaping Use   Vaping Use: Never used  Substance Use Topics   Alcohol use: No   Drug use: Never     Allergies   Cashew nut oil, Empagliflozin, Justicia adhatoda (malabar nut tree) [justicia adhatoda], Statins, and Zofran [ondansetron hcl]   Review of Systems Review of Systems  Respiratory:  Positive for shortness of breath.   Per HPI   Physical Exam Triage Vital Signs ED Triage Vitals [10/08/22 1722]  Enc Vitals Group     BP (!) 154/105     Pulse Rate (!) 111     Resp  (!) 22     Temp      Temp Source Oral     SpO2 96 %     Weight      Height      Head Circumference      Peak Flow      Pain Score      Pain Loc      Pain Edu?      Excl. in [place]?    No data found.  Updated Vital Signs BP 107/65 (BP Location: Left Arm)   Pulse (!) 111   Temp 100 F (37.8 C) (Oral)   Resp 20   LMP 09/15/2016 (Within Weeks)   SpO2 94%   Visual Acuity Right Eye Distance:   Left Eye Distance:   Bilateral Distance:    Right Eye Near:   Left Eye Near:    Bilateral Near:     Physical Exam Vitals and nursing note reviewed.  Constitutional:      Appearance: She is ill-appearing. She is not toxic-appearing.  HENT:     Head: Normocephalic and atraumatic.     Right Ear: Hearing, tympanic membrane, ear canal and external ear normal.     Left Ear: Hearing, tympanic membrane, ear canal and external ear normal.     Nose: Nose normal.     Mouth/Throat:     Lips: Pink.     Mouth: Mucous membranes are moist.  Eyes:     General: Lids are normal. Vision grossly intact. Gaze aligned appropriately.     Extraocular Movements: Extraocular movements intact.     Conjunctiva/sclera: Conjunctivae normal.     Pupils: Pupils are equal, round, and reactive to light.  Cardiovascular:     Rate and Rhythm: Normal rate and regular rhythm.     Heart sounds: Normal heart sounds, S1 normal and S2 normal.  Pulmonary:     Effort: Pulmonary effort is normal. No respiratory distress.     Breath sounds: Normal air entry. Wheezing present.     Comments: Initial assessment: Diffuse inspiratory and expiratory wheezing heard to all lung fields.  Speaking in full sentences without difficulty.  Increased respiratory rate and respiratory effort.  No retractions or prolonged expiration. Chest:     Chest wall: No tenderness.  Musculoskeletal:     Cervical back: Neck supple.  Lymphadenopathy:     Cervical: No cervical adenopathy.  Skin:  General: Skin is warm and dry.     Capillary Refill:  Capillary refill takes less than 2 seconds.     Findings: No rash.  Neurological:     General: No focal deficit present.     Mental Status: She is alert and oriented to person, place, and time. Mental status is at baseline.     Cranial Nerves: No dysarthria or facial asymmetry.  Psychiatric:        Mood and Affect: Mood normal.        Speech: Speech normal.        Behavior: Behavior normal.        Thought Content: Thought content normal.        Judgment: Judgment normal.      UC Treatments / Results  Labs (all labs ordered are listed, but only abnormal results are displayed) Labs Reviewed  CBG MONITORING, ED    EKG   Radiology DG Chest 2 View  Result Date: 10/08/2022 CLINICAL DATA:  Shortness of breath. Wheezing. Influenza. EXAM: CHEST - 2 VIEW COMPARISON:  Radiograph 10/03/2022 FINDINGS: Again seen peribronchial thickening, similar to prior exam. No focal airspace disease. Stable heart size and mediastinal contours. No pleural effusion, pneumothorax or pneumomediastinum. Stable osseous structures. IMPRESSION: Peribronchial thickening, similar to prior exam. No focal airspace disease. Electronically Signed   By: Keith Rake M.D.   On: 10/08/2022 18:28    Procedures Procedures (including critical care time)  Medications Ordered in UC Medications  ipratropium-albuterol (DUONEB) 0.5-2.5 (3) MG/3ML nebulizer solution 3 mL (3 mLs Nebulization Given 10/08/22 1736)  acetaminophen (TYLENOL) tablet 975 mg (975 mg Oral Given 10/08/22 1736)  albuterol (PROVENTIL) (2.5 MG/3ML) 0.083% nebulizer solution 2.5 mg (2.5 mg Nebulization Given 10/08/22 1839)  methylPREDNISolone sodium succinate (SOLU-MEDROL) 125 mg/2 mL injection 80 mg (80 mg Intramuscular Given 10/08/22 1839)    Initial Impression / Assessment and Plan / UC Course  I have reviewed the triage vital signs and the nursing notes.  Pertinent labs & imaging results that were available during my care of the patient were reviewed  by me and considered in my medical decision making (see chart for details).   1. Influenza, acute bronchitis, shortness of breath Presentation consistent with viral bronchitis due to influenza. DuoNeb provided after initial assessment. This improved subjective shortness of breath and lung sounds slightly. Chest x-ray performed to rule out pneumonia is negative for focal infectious process, shows peribronchial thickening similar to x-ray performed 6 days ago. Solumedrol 60m IM given to reduce inflammation in the airways. Second breathing treatment (albuterol) given prior to discharge. Breath sounds and subjective shortness of breath significantly improved after second albuterol nebulizer. No indication for ED referral, O2 remained stable on room air prior to and after breathing treatments. Heart rate elevated prior to and after albuterol. Likely mildly dehydrated due to viral illness and decreased oral intake. Recommend pushing fluids to stay well hydrated. CBG 90 in clinic.   She may stop taking tamiflu as this was started on day 6 of symptoms and is not helping. Prednisone burst 243mstarting tomorrow for the next 5 days with food in the mornings. Albuterol continued as needed every 4-6 hours at home. Guaifenesin to break up mucous every 12 hours. Tessalon pearles every 8 hours as needed for cough. Promethazine DM at bedtime for cough, drowsiness precautions discussed. She is agreeable with this plan.   Discussed physical exam and available lab work findings in clinic with patient.  Counseled patient regarding appropriate use of  medications and potential side effects for all medications recommended or prescribed today. Discussed red flag signs and symptoms of worsening condition,when to call the PCP office, return to urgent care, and when to seek higher level of care in the emergency department. Patient verbalizes understanding and agreement with plan. All questions answered. Patient discharged in stable  condition.    Final Clinical Impressions(s) / UC Diagnoses   Final diagnoses:  Influenza with respiratory manifestation  Acute cough  Acute bronchitis, unspecified organism  Shortness of breath     Discharge Instructions      You have bronchitis which is inflammation of the upper airways in your lungs related to influenza virus. Your chest x-ray is negative for pneumonia.  Take prednisone once daily for the next 5 days with breakfast starting tomorrow morning.  Do not take any other NSAID containing medications such as ibuprofen or naproxen/Aleve while taking prednisone.  You may use albuterol inhaler 1 to 2 puffs every 4-6 hours as needed for cough, shortness of breath, and wheezing.  You may use tessalon pearles every 8 hours as needed for coughing.  You may use Promethazine DM cough syrup at bedtime as needed.  Do not use this during the day, if you go to work, or drink alcohol when using this as it can make you very sleepy.   If you develop any new or worsening symptoms or do not improve in the next 2 to 3 days, please return.  If your symptoms are severe, please go to the emergency room.  Follow-up with your primary care provider for further evaluation and management of your symptoms as well as ongoing wellness visits.  I hope you feel better!      ED Prescriptions     Medication Sig Dispense Auth. Provider   predniSONE (DELTASONE) 20 MG tablet Take 1 tablet (20 mg total) by mouth daily for 5 days. 5 tablet Talbot Grumbling, FNP   promethazine-dextromethorphan (PROMETHAZINE-DM) 6.25-15 MG/5ML syrup Take 5 mLs by mouth at bedtime as needed for cough. 118 mL Talbot Grumbling, FNP      PDMP not reviewed this encounter.   Talbot Grumbling, [place] 10/09/22 [phone]

## 2022-10-08 NOTE — Discharge Instructions (Signed)
You have bronchitis which is inflammation of the upper airways in your lungs related to influenza virus. Your chest x-ray is negative for pneumonia.  Take prednisone once daily for the next 5 days with breakfast starting tomorrow morning.  Do not take any other NSAID containing medications such as ibuprofen or naproxen/Aleve while taking prednisone.  You may use albuterol inhaler 1 to 2 puffs every 4-6 hours as needed for cough, shortness of breath, and wheezing.  You may use tessalon pearles every 8 hours as needed for coughing.  You may use Promethazine DM cough syrup at bedtime as needed.  Do not use this during the day, if you go to work, or drink alcohol when using this as it can make you very sleepy.   If you develop any new or worsening symptoms or do not improve in the next 2 to 3 days, please return.  If your symptoms are severe, please go to the emergency room.  Follow-up with your primary care provider for further evaluation and management of your symptoms as well as ongoing wellness visits.  I hope you feel better!

## 2022-10-08 NOTE — ED Triage Notes (Signed)
Chief Complaint: Patient tested positive for the flu and was seen on Wednesday. Having increased SOB. Nausea, headache, chest discomfort as well.   Onset: last week.   OTC medications tried: Yes- albuterol, Flovent, Tamiflu    with no relief  Sick exposure: Yes- family  New foods or medications: No  Recent Travel: No

## 2022-11-02 MED ORDER — MOUNJARO 7.5 MG/0.5 ML SUBCUTANEOUS PEN INJECTOR
SUBCUTANEOUS | 3 refills | 0 days | Status: CP
Start: 2022-11-02 — End: 2022-12-02

## 2022-11-12 DIAGNOSIS — Z794 Long term (current) use of insulin: Principal | ICD-10-CM

## 2022-11-12 DIAGNOSIS — E1142 Type 2 diabetes mellitus with diabetic polyneuropathy: Principal | ICD-10-CM

## 2022-11-12 MED ORDER — METFORMIN ER 500 MG TABLET,EXTENDED RELEASE 24 HR
ORAL_TABLET | Freq: Every day | ORAL | 3 refills | 22 days | Status: CP
Start: 2022-11-12 — End: 2023-10-05

## 2022-11-12 MED ORDER — LISINOPRIL 20 MG-HYDROCHLOROTHIAZIDE 25 MG TABLET
ORAL_TABLET | Freq: Every day | ORAL | 3 refills | 90 days | Status: CP
Start: 2022-11-12 — End: 2023-10-05

## 2022-11-22 ENCOUNTER — Ambulatory Visit
Admit: 2022-11-22 | Discharge: 2022-11-23 | Payer: PRIVATE HEALTH INSURANCE | Attending: Internal Medicine | Primary: Internal Medicine

## 2022-11-22 DIAGNOSIS — E1142 Type 2 diabetes mellitus with diabetic polyneuropathy: Principal | ICD-10-CM

## 2022-11-22 DIAGNOSIS — Z794 Long term (current) use of insulin: Principal | ICD-10-CM

## 2022-11-22 DIAGNOSIS — I10 Essential (primary) hypertension: Principal | ICD-10-CM

## 2022-11-22 DIAGNOSIS — R21 Rash and other nonspecific skin eruption: Principal | ICD-10-CM

## 2022-11-22 MED ORDER — LANTUS SOLOSTAR U-100 INSULIN 100 UNIT/ML (3 ML) SUBCUTANEOUS PEN
Freq: Two times a day (BID) | SUBCUTANEOUS | 3 refills | 90 days | Status: CP
Start: 2022-11-22 — End: 2023-11-22

## 2022-11-22 MED ORDER — METOPROLOL TARTRATE 25 MG TABLET
ORAL_TABLET | Freq: Two times a day (BID) | ORAL | 0 refills | 90 days | Status: CP
Start: 2022-11-22 — End: 2023-02-20

## 2022-11-22 MED ORDER — LISINOPRIL 20 MG-HYDROCHLOROTHIAZIDE 25 MG TABLET
ORAL_TABLET | Freq: Every day | ORAL | 3 refills | 90 days | Status: CP
Start: 2022-11-22 — End: 2023-10-15

## 2022-11-28 DIAGNOSIS — I10 Essential (primary) hypertension: Principal | ICD-10-CM

## 2022-12-27 ENCOUNTER — Other Ambulatory Visit: Payer: Self-pay | Admitting: Chiropractor

## 2022-12-27 ENCOUNTER — Ambulatory Visit
Admission: RE | Admit: 2022-12-27 | Discharge: 2022-12-27 | Disposition: A | Discharge status: Home / Self Care | Payer: 59 | Attending: Chiropractor | Admitting: Chiropractor

## 2022-12-27 ENCOUNTER — Ambulatory Visit
Admission: RE | Admit: 2022-12-27 | Discharge: 2022-12-27 | Disposition: A | Discharge status: Home / Self Care | Payer: 59 | Source: Ambulatory Visit | Attending: Chiropractor | Admitting: Chiropractor

## 2022-12-27 DIAGNOSIS — S233XXA Sprain of ligaments of thoracic spine, initial encounter: Secondary | ICD-10-CM | POA: Diagnosis present

## 2022-12-27 DIAGNOSIS — S39012A Strain of muscle, fascia and tendon of lower back, initial encounter: Secondary | ICD-10-CM | POA: Diagnosis present

## 2023-01-22 DIAGNOSIS — E1142 Type 2 diabetes mellitus with diabetic polyneuropathy: Principal | ICD-10-CM

## 2023-01-22 DIAGNOSIS — I10 Essential (primary) hypertension: Principal | ICD-10-CM

## 2023-01-22 DIAGNOSIS — Z794 Long term (current) use of insulin: Principal | ICD-10-CM

## 2023-01-23 MED ORDER — AMLODIPINE 5 MG TABLET
ORAL_TABLET | Freq: Every day | ORAL | 11 refills | 30 days | Status: CP
Start: 2023-01-23 — End: 2024-01-23

## 2023-01-23 MED ORDER — METOPROLOL TARTRATE 25 MG TABLET
ORAL_TABLET | Freq: Two times a day (BID) | ORAL | 3 refills | 90 days | Status: CP
Start: 2023-01-23 — End: ?

## 2023-01-23 MED ORDER — LISINOPRIL 20 MG-HYDROCHLOROTHIAZIDE 25 MG TABLET
ORAL_TABLET | Freq: Every day | ORAL | 3 refills | 90 days | Status: CP
Start: 2023-01-23 — End: 2023-12-16

## 2023-01-24 MED ORDER — SPIRONOLACTONE 50 MG TABLET
ORAL_TABLET | Freq: Every day | ORAL | 2 refills | 90 days | Status: CP
Start: 2023-01-24 — End: ?

## 2023-01-24 MED ORDER — LANTUS SOLOSTAR U-100 INSULIN 100 UNIT/ML (3 ML) SUBCUTANEOUS PEN
Freq: Two times a day (BID) | SUBCUTANEOUS | 3 refills | 90 days | Status: CP
Start: 2023-01-24 — End: 2024-01-24

## 2023-02-10 MED FILL — MOUNJARO 5 MG/0.5 ML SUBCUTANEOUS PEN INJECTOR: SUBCUTANEOUS | 30 days supply | Qty: 2 | Fill #0

## 2023-02-25 ENCOUNTER — Ambulatory Visit: Admit: 2023-02-25 | Payer: PRIVATE HEALTH INSURANCE | Attending: Internal Medicine | Primary: Internal Medicine

## 2023-03-05 MED ORDER — MOUNJARO 5 MG/0.5 ML SUBCUTANEOUS PEN INJECTOR
SUBCUTANEOUS | 0 refills | 30 days | Status: CP
Start: 2023-03-05 — End: 2023-04-04

## 2023-03-24 MED ORDER — MOUNJARO 7.5 MG/0.5 ML SUBCUTANEOUS PEN INJECTOR
SUBCUTANEOUS | 3 refills | 0 days | Status: CP
Start: 2023-03-24 — End: 2023-04-23

## 2023-04-15 MED ORDER — METFORMIN ER 500 MG TABLET,EXTENDED RELEASE 24 HR
ORAL_TABLET | Freq: Every day | ORAL | 2 refills | 90 days | Status: CP
Start: 2023-04-15 — End: 2024-03-07

## 2023-04-15 MED ORDER — IBUPROFEN 800 MG TABLET
ORAL_TABLET | 2 refills | 0 days | Status: CP
Start: 2023-04-15 — End: ?

## 2023-04-16 ENCOUNTER — Ambulatory Visit
Admit: 2023-04-16 | Discharge: 2023-04-17 | Payer: PRIVATE HEALTH INSURANCE | Attending: Internal Medicine | Primary: Internal Medicine

## 2023-04-16 DIAGNOSIS — F418 Other specified anxiety disorders: Principal | ICD-10-CM

## 2023-04-16 DIAGNOSIS — E559 Vitamin D deficiency, unspecified: Principal | ICD-10-CM

## 2023-04-16 DIAGNOSIS — K760 Fatty (change of) liver, not elsewhere classified: Principal | ICD-10-CM

## 2023-04-16 DIAGNOSIS — C541 Malignant neoplasm of endometrium: Principal | ICD-10-CM

## 2023-04-16 DIAGNOSIS — I10 Essential (primary) hypertension: Principal | ICD-10-CM

## 2023-04-16 DIAGNOSIS — F3342 Major depressive disorder, recurrent, in full remission: Principal | ICD-10-CM

## 2023-04-16 MED ORDER — METOPROLOL TARTRATE 25 MG TABLET
ORAL_TABLET | Freq: Two times a day (BID) | ORAL | 1 refills | 90 days | Status: CP
Start: 2023-04-16 — End: ?

## 2023-04-16 MED ORDER — DULOXETINE 60 MG CAPSULE,DELAYED RELEASE
ORAL_CAPSULE | Freq: Every day | ORAL | 3 refills | 90 days | Status: CP
Start: 2023-04-16 — End: 2024-04-10

## 2023-05-02 ENCOUNTER — Ambulatory Visit: Admit: 2023-05-02 | Discharge: 2023-05-03 | Payer: PRIVATE HEALTH INSURANCE

## 2023-05-12 ENCOUNTER — Ambulatory Visit
Admit: 2023-05-12 | Discharge: 2023-05-13 | Payer: PRIVATE HEALTH INSURANCE | Attending: Internal Medicine | Primary: Internal Medicine

## 2023-05-12 DIAGNOSIS — I10 Essential (primary) hypertension: Principal | ICD-10-CM

## 2023-05-12 DIAGNOSIS — K760 Fatty (change of) liver, not elsewhere classified: Principal | ICD-10-CM

## 2023-05-12 DIAGNOSIS — E1165 Type 2 diabetes mellitus with hyperglycemia: Principal | ICD-10-CM

## 2023-05-12 DIAGNOSIS — Z794 Long term (current) use of insulin: Principal | ICD-10-CM

## 2023-05-12 MED ORDER — MOUNJARO 2.5 MG/0.5 ML SUBCUTANEOUS PEN INJECTOR
SUBCUTANEOUS | 0 refills | 28 days | Status: CP
Start: 2023-05-12 — End: 2023-06-03
  Filled 2023-05-30: qty 2, 28d supply, fill #0

## 2023-05-16 NOTE — Unmapped (Signed)
[facility] Specialty Medication Onboarding    Specialty Medication: MOUNJARO 2.5 mg/0.5 mL Pnij (tirzepatide)  Prior Authorization: Not Required   Financial Assistance: No - copay  <$25  Final Copay/Day Supply: $25 / 28    Insurance Restrictions: None     Notes to Pharmacist: dose change  Credit Card on File: no    The triage team has completed the benefits investigation and has determined that the patient is able to fill this medication at [facility]. Please contact the patient to complete the onboarding or follow up with the prescribing physician as needed.

## 2023-05-21 NOTE — Unmapped (Signed)
[facility] has reached out to this patient via MyChart to onboard them to our Specialty Lite services for their [facility]. Their medication has never been filled.They will now receive proactive outreach from the pharmacy team for refills.    Darryl Nestle, PharmD  [facility] Specialty and Home Delivery Pharmacist

## 2023-05-29 NOTE — Unmapped (Signed)
Robin Jimenez requested a refill of their Mounjaro via IVR/Web. The [facility] has scheduled delivery per the patients request via Same Day Courier to be delivered to their prescription address on 05/30/23.

## 2023-06-06 ENCOUNTER — Ambulatory Visit: Admit: 2023-06-06 | Discharge: 2023-06-07 | Payer: PRIVATE HEALTH INSURANCE

## 2023-06-06 DIAGNOSIS — E1142 Type 2 diabetes mellitus with diabetic polyneuropathy: Principal | ICD-10-CM

## 2023-06-06 DIAGNOSIS — Z794 Long term (current) use of insulin: Principal | ICD-10-CM

## 2023-06-06 MED ORDER — INSULIN GLARGINE (U-300) CONC. 300 UNIT/ML (3 ML) SUBCUTANEOUS PEN
3 refills | 0 days | Status: CP
Start: 2023-06-06 — End: ?

## 2023-06-06 MED ORDER — TOPIRAMATE 25 MG SPRINKLE CAPSULE
ORAL_CAPSULE | Freq: Two times a day (BID) | ORAL | 3 refills | 90 days | Status: CP
Start: 2023-06-06 — End: 2024-06-05

## 2023-06-06 MED ORDER — INSULIN GLARGINE (U-100) 100 UNIT/ML (3 ML) SUBCUTANEOUS PEN
12 refills | 0 days | Status: CP
Start: 2023-06-06 — End: ?

## 2023-06-06 MED ORDER — INSULIN LISPRO (U-200) 200 UNIT/ML (3 ML) SUBCUTANEOUS PEN
3 refills | 0 days | Status: CP
Start: 2023-06-06 — End: ?

## 2023-06-06 NOTE — Unmapped (Signed)
Lab Results   Component Value Date    A1C >14.0 (H) 04/16/2023     Glu:  309  Last Meal: Breakfast   Meter: Teacher, adult education in Careers information officer  Pump: n/a

## 2023-06-06 NOTE — Unmapped (Signed)
Endocrine Follow Up Note  Assessment and Plan: Robin Jimenez is a 52 y.o. female who is seen in follow up.    1. Type 2 diabetes, uncontrolled: CGM downloaded and interpreted x72 hours or more. Daily trends reviewed including patterns for frequent mid-morning hyperglycemia.  Lab Results   Component Value Date    A1C >14.0 (H) 04/16/2023    A1C 9.4 (H) 11/22/2022    A1C 11.9 (H) 09/03/2022     A1c goal less than 7.0% currently above goal 14%.  Complicated diabetes history with severe obesity patient with medication noncompliance issues over the last few months.  Continue Mounjaro 2.5 mg weekly injection increase after 4 weeks to 5 mg.  Try to globally increase insulin submit Toujeo U300 and Humalog U200 to help with volume with injections.  Will increase insulin basal insulin from 50 every 12 to 65 every 12, and Humalog 60 with meals to 70 with meals , will need to be aggressive with insulin titration and Mounjaro titration moving forward.  May ultimately need U-500 insulin if not able to tolerate higher GLP-1 doses.  She said her biggest frustration right now is her libre sensor which she feels like she cannot wear on her arm has not tried wearing on her abdomen recommended trying this even though is not FDA approved in order to further evaluate the patient can confirm on fingersticks if there are abnormal readings she expressed understanding to this plan.  I think this is better than her not wearing it at all along she is confirming with point-of-care glucoses we can get an idea of how accurate it is.  Need to see her back in a month for close follow-up both for insulin titration but also for possible transition to U-500 insulin if needed.      - Start Toujeo 65 units U300 units every 12 hours  -  start Humalog U200 70 units with meal  - Continue Metformin 2gm with dinner  - Continue Mounjaro with up titration per PCP  - RTC in 1 month    Diabetes-related complications:  - [x]  Retinopathy: Last eye exam 11/2021 with high-risk PDR  - []  Neuropathy: (+) symptoms, last foot exam today 02/2022, discussed proper foot care  > on Duloxetine 90mg   - []  Nephropathy:   Lab Results   Component Value Date    ALBCRERAT 31.6 (H) 09/03/2022    EGFR >90 04/16/2023   > on Lisinopril 20mg , Spironolactone 50mg   - ASCVD: []  CAD/MI, []  CVA   Lab Results   Component Value Date    LDL 94 02/04/2020    LDL 106.0 02/04/2020    HDL 45 02/04/2020    CHOL 474 (H) 02/04/2020    TRIG 318 (H) 02/04/2020   > History of pancreatitis (reports being hospitalized) with statin, she is taking an OTC fish oil supplement - discussed repeat lipid panel today however she declined management - will need repeat lipid panel and consideration of pharmacotherapy at next appointment  - []  HTN: Blood Pressure for the past 24 hrs:   BP   06/06/23 1408 136/82        2. Elevated 24 hour UFC: 24 hour UFC 49 in 05/2020 with slightly low 24 hour urine creatinine 686. No Cushing's-specific symptoms.  - Discussed repeat 24 hour UFC - she reported Dr. Sheria Lang previously recommended a 1mg  DST and would like to pursue this instead  -Consider 2 mg dose dex suppression test at next visit    Patient was  discussed with Dr.  Milford Cage Teletha Petrea  Endocrinology Fellow HO-5  June 06, 2023 7:45 AM      Subjective: HPI: Robin Jimenez is a 52 y.o. female who is seen in follow up regarding T2DM. Dr. Sheria Lang last saw her in 04/2020. She just wants to get back on track, life has been stressful related to her father's health.    With regard to treatment for diabetes patient reports she has been on glipizide, metformin Humalog Lantus amd Moujaro.  Says that about 3 months ago they switched her sensor from Dexcom sensors to [place] sensors which is very frustrating for her to see whether insurance would cover.  Says the libre sensors keep falling off as result she got very frustrated with her diabetes care and has stopped taking all of her medications for extended period of time.  A1c up to 14% in June as a result, says that she restarted taking her insulin a few weeks ago and that she is still noticed blood sugars consistently in the 200s and 300s.    Jardiance discontinued in the past due to recurrent genital yeast infections.    Diabetes  Current medications:  - Lantus 50 units at Q12  - Humalog 60 units 2-3 times/day regardless of whether she eats (8am, 2-4pm, 9pm)  - Metformin 2gm daily with lunch  - Moujaro 2.5 mg     Denies side effects other than some loose stools with Metformin which she is used to, denies nausea/vomiting.       Lifestyle patterns:  - Eats 2-3 meals/day with snacks (chips, soda, etc.)  > Breakfast: Largest meal - usually heavy in carbs  > Sometimes skips lunch or dinner    Walks around work, occasional laps around work place, no dedicated exercise.       BG monitoring:  - Dexcom G6 CGM (review above)       Hypoglycemia: Reports awareness, rare symptoms.       Diabetes history: Diagnosed in 2003. Family history of diabetes in sister, aunt.       Medication intolerances: Did not tolerate Jardiance due to recurrent yeast infections. History of pancreatitis, started Rybelsus and developed nausea/vomiting but able to tolerate Ozempic.       Review of Systems: 10 systems reviewed and were negative except for as noted in HPI and below.    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 Seen by surgery -  no e/o gallstones or indication for surgery.  Recurrence summer 2018 - presumed due to atorvastatin in the past. Recurrence on pravastatin therefore statin d/c'd    Pneumonia 03/2017    Tinnitus of both ears 04/13/2015    Tobacco use disorder 01/26/2011    actively smoking, not ready to quit     Type 2 diabetes mellitus with diabetic polyneuropathy, with long-term current use of insulin (CMS-HCC) 12/30/2006    Past Surgical History:   Procedure Laterality Date    COLPOSCOPY  age 61, 36    FOOT SURGERY  1997    bone removed little toe    HYSTERECTOMY      LIPOMA RESECTION  2012    [facility]    OOPHORECTOMY      PR DILATION/CURETTAGE,DIAGNOSTIC N/A 08/14/2017    Procedure: DILATION AND CURETTAGE, DIAGNOSTIC AND/OR THERAPEUTIC (NON OBSTETRICAL);  Surgeon: Estil Daft, MD;  [facility]: [facility];  Service: [facility] Primary Gynecology    PR DILATION/CURETTAGE,DIAGNOSTIC Midline 09/10/2017    Procedure: DILATION AND CURETTAGE, DIAGNOSTIC AND/OR THERAPEUTIC (NON OBSTETRICAL);  Surgeon: Dossie Der, MD;  Location: MAIN OR [facility];  Service: Gynecology Oncology    PR HYSTEROSCOPY,DX,SEP PROC Midline 08/14/2017    Procedure: HYSTEROSCOPY, DIAGNOSTIC (SEPARATE PROCEDURE);  Surgeon: Estil Daft, MD;  Location: [facility] OR [facility];  Service: [facility] Primary Gynecology    PR INSERT INTRAUTERINE DEVICE Midline 09/10/2017    Procedure: INSERTION OF INTRAUTERINE DEVICE (IUD);  Surgeon: Dossie Der, MD;  Location: MAIN OR [facility];  Service: Gynecology Oncology    PR INTRAOPERATIVE SENTINEL LYMPH NODE ID W DYE INJECTION N/A 01/07/2018    Procedure: INTRAOPERATIVE IDENTIFICATION SENTINEL LYMPH NODE(S) INCLUDE INJECTION NON-RADIOACTIVE DYE, WHEN PERFORMED;  Surgeon: Dossie Der, MD;  Location: MAIN OR [facility];  Service: Gynecology Oncology    PR LAPAROSCOPY TOT HYSTERECTOMY UTERUS >250 GRAM W TUBE/OVARY Bilateral 01/07/2018    Procedure: ROBOTIC XI LAPAROSCOPY, SURGICAL, WITH TOTAL HYSTERECTOMY, UTERUS GREATER THAN 250 G, W/REMOVAL TUBE(S) &/OR OVARY(S);  Surgeon: Dossie Der, MD;  Location: MAIN OR [facility];  Service: Gynecology Oncology    PR REMOVE PELVIS LYMPH NODES Left 01/07/2018    Procedure: Robotic Xi Pelvic Lymphadenectomy W/Ext Iliac (Separt Proc);  Surgeon: Dossie Der, MD;  Location: MAIN OR [facility];  Service: Gynecology Oncology    PR UPPER GI ENDOSCOPY,BIOPSY N/A 04/08/2018    Procedure: UGI ENDOSCOPY; WITH BIOPSY, SINGLE OR MULTIPLE;  Surgeon: Janyth Pupa, MD;  [facility]: [facility];  Service: Gastroenterology      Family History   Problem Relation Age of Onset    Hypertension Mother     Hyperlipidemia Mother     Thyroid disease Mother     Stroke Mother     Hypertension Father     Gout Sister     Diabetes type I Sister         also has a prob with pancreas, sister is LPN, can help her with DM    Diabetes Sister     Stroke Sister     Hypertension Maternal Aunt     Heart disease Maternal Aunt     Diabetes Maternal Aunt     Hypertension Maternal Uncle     Heart disease Maternal Uncle     Cervical cancer Paternal Aunt     Hypertension Paternal Aunt     Thyroid disease Paternal Aunt     Hypertension Paternal Aunt     Diabetes Paternal Aunt  Hypertension Paternal Uncle     Heart disease Maternal Grandmother     COPD Maternal Grandfather     Diabetes Maternal Grandfather     Stroke Maternal Grandfather     Diabetes Paternal Grandmother     Heart disease Paternal Grandmother     Stroke Paternal Grandmother     Diabetes Paternal Grandfather     Heart disease Paternal Grandfather     Stroke Paternal Grandfather     Hypertension Paternal Grandfather     Breast cancer Other     Cancer Neg Hx     Melanoma Neg Hx     Basal cell carcinoma Neg Hx     Squamous cell carcinoma Neg Hx     Anesthesia problems Neg Hx     Bleeding Disorder Neg Hx     Social History     Tobacco Use    Smoking status: Light Smoker     Current packs/day: 0.50     Average packs/day: 0.5 packs/day for 25.0 years (12.5 ttl pk-yrs)     Types: Cigarettes     Passive exposure: Current    Smokeless tobacco: Never   Substance Use Topics    Alcohol use: Never    Drug use: Never     Works at [employer], desk job     Current Medications:    Current Outpatient Medications:     albuterol HFA 90 mcg/actuation inhaler, Inhale 2 puffs. (Patient not taking: Reported on 05/12/2023), Disp: , Rfl:     amlodipine (NORVASC) 5 MG tablet, Take 1 tablet (5 mg total) by mouth daily., Disp: 30 tablet, Rfl: 11    benzonatate (TESSALON) 100 MG capsule, Take 1-2 tablets 3 times a day as needed for cough (Patient not taking: Reported on 05/12/2023), Disp: , Rfl:     blood-glucose meter kit, by Other route once for 1 dose. Use to check blood sugars. Please dispense per insurance coverage. ICD-10 E11.9 (Patient not taking: Reported on 05/12/2023), Disp: 1 each, Rfl: 0    blood-glucose meter,continuous (DEXCOM G6 RECEIVER) Misc, Dispense DexCom G6 Receiver, Disp: 1 each, Rfl: 0    blood-glucose sensor (DEXCOM G6 SENSOR) Devi, Dispense DexCom G6 sensors; Use 1 sensor q 10 days (Patient not taking: Reported on 05/12/2023), Disp: 3 each, Rfl: 11    blood-glucose transmitter (DEXCOM G6 TRANSMITTER) Devi, DexCom G6 transmitter; Use 1 every 90 days (Patient not taking: Reported on 05/12/2023), Disp: 1 each, Rfl: 0    blood-glucose transmitter (DEXCOM G6 TRANSMITTER) Devi, USE 1 EVERY 90 DAYS (Patient not taking: Reported on 05/12/2023), Disp: 1 each, Rfl: 0    cyclobenzaprine (FLEXERIL) 10 MG tablet, Take 1 tablet (10 mg total) by mouth nightly as needed for muscle spasms. (Patient not taking: Reported on 05/12/2023), Disp: 30 tablet, Rfl: 6    DULoxetine (CYMBALTA) 60 MG capsule, Take 2 capsules (120 mg total) by mouth daily., Disp: 180 capsule, Rfl: 3    fluticasone propionate (FLOVENT HFA) 110 mcg/actuation inhaler, Inhale 2 puffs daily with evening meal., Disp: 12 g, Rfl: 0    ibuprofen (MOTRIN) 800 MG tablet, Take 1 tablet (800 mg) by mouth every 8 hours as needed for pain., Disp: 60 tablet, Rfl: 2    insulin glargine (BASAGLAR, LANTUS) 100 unit/mL (3 mL) injection pen, Use up to 130 units/day, as per MD instructions, Disp: 135 mL, Rfl: 12    insulin glargine U-300 conc (TOUJEO MAX U-300 SOLOSTAR) 300 unit/mL (3 mL) InPn, Use up to 120 units subcutaneous daily  as per MD instructions; dispense 1 mo supply (4 pens or 12 mL), or 3 mo supply (12 pens pens or 36 mL), as per patient and insurance preference, Disp: 36 mL, Rfl: 3    insulin lispro (HUMALOG KWIKPEN INSULIN) 100 unit/mL injection pen, Up to 60 units TID with meals, Disp: 135 mL, Rfl: 3    insulin lispro (HUMALOG) 200 unit/mL (3 mL) injection pen, Use TID as directed by MD, up to total daily dose 200 units/day 70 units with meals for now, Disp: 45 mL, Rfl: 3    ketoconazole (NIZORAL) 2 % cream, Apply daily to the areas on the face with itching and scaling, Disp: 15 g, Rfl: 2    lisinopril-hydroCHLOROthiazide (PRINZIDE,ZESTORETIC) 20-25 mg per tablet, Take 1 tablet by mouth daily., Disp: 90 tablet, Rfl: 3    metFORMIN (GLUCOPHAGE-XR) 500 MG 24 hr tablet, Take 4 tablets (2,000 mg total) by mouth daily with evening meal., Disp: 360 tablet, Rfl: 2    pen needle, diabetic (BD ULTRA-FINE NANO PEN NEEDLE) 32 gauge x 5/32 (4 mm) Ndle, 1 each by Miscellaneous route four (4) times a day., Disp: 200 each, Rfl: 4    polyethylene glycol (MIRALAX) 17 gram packet, Take 17 g by mouth daily., Disp: 30 packet, Rfl: 5    spironolactone (ALDACTONE) 50 MG tablet, TAKE 1 TABLET BY MOUTH DAILY, Disp: 90 tablet, Rfl: 2    tirzepatide (MOUNJARO) 2.5 mg/0.5 mL PnIj, Inject 0.5 mL (2.5 mg total) under the skin every seven (7) days for 4 doses., Disp: 2 mL, Rfl: 0    [START ON 06/09/2023] tirzepatide (MOUNJARO) 5 mg/0.5 mL PnIj, Inject 5 mg under the skin every seven (7) days for 4 doses. Start after finishing 1 month of 2.5 dose., Disp: 2 mL, Rfl: 0    [START ON 07/07/2023] tirzepatide (MOUNJARO) 7.5 mg/0.5 mL PnIj, Inject 7.5 mg under the skin every seven (7) days for 4 doses. Start after finishing 1 month of 7.5 dose., Disp: 2 mL, Rfl: 0    topiramate (TOPAMAX) 25 MG capsule, Take 1 capsule (25 mg total) by mouth two (2) times a day., Disp: 180 capsule, Rfl: 3    triamcinolone (KENALOG) 0.1 % cream, Apply topically Two (2) times a day. For up to two weeks until rash clears, Disp: 80 g, Rfl: 12    turmeric root extract 500 mg cap, Take as directed, Disp: 30 capsule, Rfl: 0  Allergies:  Allergies   Allergen Reactions    Cashew Nut Hives     Cashew Nut Oil    Ondansetron Hcl Hives     unknown    Children's Zyrtec Hives Relief      unknown    Empagliflozin      Recurrent yeast infection    Statins-Hmg-Coa Reductase Inhibitors      Pancreatitis, recurrent    Tree Nut      unknown     Objective:  BP 136/82  - Pulse 88  - Wt (!) 172.8 kg (381 lb)  - LMP  (LMP Unknown)  - BMI 56.26 kg/m??   Wt Readings from Last 12 Encounters:   06/06/23 (!) 172.8 kg (381 lb)   05/12/23 (!) 169.6 kg (374 lb)   04/16/23 (!) 172.3 kg (379 lb 12.8 oz)   11/22/22 (!) 170.1 kg (375 lb)   10/04/22 (!) 158.8 kg (350 lb)   09/03/22 (!) 170.4 kg (375 lb 9.6 oz)   04/09/22 (!) 171.9 kg (379 lb)   03/06/22 (!) 173.4  kg (382 lb 3.2 oz)   02/26/22 (!) 174.5 kg (384 lb 12.8 oz)   02/18/22 (!) 174.5 kg (384 lb 12.8 oz)   11/28/21 (!) 174.7 kg (385 lb 3.2 oz)   11/07/21 (!) 170.1 kg (375 lb)     General: Well-appearing female in no apparent distress  HEENT: EOMI, sclera anicteric  Cardiovascular: Regular rate and rhythm  Respiratory: Breathing comfortably on room air  MSK: Normal station and gait, no edema  Neuro: Awake, alert, and oriented, no focal deficits  Psych: Normal mood and affect    Labs:  I personally reviewed pertinent labs available in Epic prior to the start of today's visit.    Labs are significant for:  Lab Results   Component Value Date    A1C >14.0 (H) 04/16/2023    A1C 9.4 (H) 11/22/2022    A1C 11.9 (H) 09/03/2022    A1C 9.2 (H) 02/18/2022    A1C 13.4 (H) 11/07/2021     Lab Results   Component Value Date    TRIG 318 (H) 02/04/2020    CHOL 203 (H) 02/04/2020    HDL 45 02/04/2020    LDL 94 02/04/2020    LDL 106.0 02/04/2020     Lab Results   Component Value Date    TSH 1.216 09/03/2022    FREET4 1.08 03/29/2016     Lab Results   Component Value Date    CREATININE 0.64 04/16/2023    GFRNAAF >90 12/25/2020    ALBCRERAT 31.6 (H) 09/03/2022     Lab Results   Component Value Date    NA 132 (L) 04/16/2023    K 3.8 04/16/2023    CL 98 04/16/2023    CO2 29.5 04/16/2023    BUN 13 04/16/2023    CREATININE 0.64 04/16/2023    GFR >= 60 01/26/2011    AST 76 (H) 04/16/2023    PROT 7.6 04/16/2023    ALBUMIN 3.4 04/16/2023       Imaging:  I personally reviewed pertinent imaging available in Epic prior to the start of today's visit.    I reviewed and summarized (above) records in preparation for today's visit all pertinent notes in Epic/Media and CareEverywhere as well as any sent records.

## 2023-06-08 NOTE — Unmapped (Signed)
I was immediately available via phone/pager or present on site.  I reviewed and discussed the case with the resident, but did not see the patient.  I agree with the assessment and plan as documented in the resident's note. Meghin Thivierge Michael Branden Shallenberger, MD

## 2023-06-09 MED ORDER — MOUNJARO 5 MG/0.5 ML SUBCUTANEOUS PEN INJECTOR
SUBCUTANEOUS | 0 refills | 0 days | Status: CP
Start: 2023-06-09 — End: 2023-07-01
  Filled 2023-06-23: qty 2, 28d supply, fill #0

## 2023-06-17 NOTE — Unmapped (Signed)
Robin Jimenez requested a refill of their Mounjaro via IVR/Web. The [facility] has scheduled delivery per the patients request via Same Day Courier to be delivered to their prescription address on 06/23/23.

## 2023-06-23 ENCOUNTER — Ambulatory Visit
Admission: RE | Admit: 2023-06-23 | Discharge: 2023-06-23 | Disposition: A | Discharge status: Home / Self Care | Payer: 59 | Source: Ambulatory Visit

## 2023-06-23 VITALS — BP 139/80 | HR 115 | Temp 99.3°F | Resp 18

## 2023-06-23 DIAGNOSIS — L0291 Cutaneous abscess, unspecified: Secondary | ICD-10-CM

## 2023-06-23 MED ORDER — DOXYCYCLINE HYCLATE 100 MG PO CAPS: 100.0000 mg | ORAL_CAPSULE | Freq: Two times a day (BID) | ORAL | 0 refills | Status: AC

## 2023-06-23 NOTE — ED Provider Notes (Signed)
Sherri Escobar    CSN: [id number] Arrival date & time: 06/23/23  1109      History   Chief Complaint Chief Complaint  Patient presents with   Abscess    Entered by patient    HPI Sherri Escobar is a 52 y.o. female.  Patient presents with 2-week history of painful abscess under her left breast.  No open wounds or drainage.  No fever, chills, or other symptoms.  She has been treating the area with rubbing alcohol.  Patient reports history of recurrent abscesses and has seen dermatology.  She was not able to get an appointment with her dermatologist for her current abscess.  Her medical history includes diabetes and hypertension.  The history is provided by the patient and medical records.    Past Medical History:  Diagnosis Date   Diabetes mellitus without complication (HCC)    High cholesterol    Hypertension     There are no problems to display for this patient.   Past Surgical History:  Procedure Laterality Date   ABDOMINAL HYSTERECTOMY     LIPOMA RESECTION      OB History   No obstetric history on file.      Home Medications    Prior to Admission medications   Medication Sig Start Date End Date Taking? Authorizing Provider  doxycycline (VIBRAMYCIN) 100 MG capsule Take 1 capsule (100 mg total) by mouth 2 (two) times daily for 7 days. 06/23/23 06/30/23 Yes Mickie Bail, NP  albuterol (VENTOLIN HFA) 108 (90 Base) MCG/ACT inhaler Inhale 2 puffs into the lungs every 6 (six) hours as needed for wheezing or shortness of breath. 10/03/22   Immordino, Jeannett Senior, FNP  amLODipine (NORVASC) 5 MG tablet Take 5 mg by mouth daily.    [provider]  atorvastatin (LIPITOR) 10 MG tablet Take 1 tablet by mouth daily. Patient not taking: Reported on 06/23/2023 09/04/20   [provider]  BD PEN NEEDLE NANO U/F 32G X 4 MM MISC 1 each by Miscellaneous route four (4) times a day. 09/16/22   [provider]  benzonatate (TESSALON) 100 MG capsule  Take 1-2 tablets 3 times a day as needed for cough Patient not taking: Reported on 06/23/2023 10/03/22   Immordino, Jeannett Senior, FNP  Continuous Blood Gluc Receiver (DEXCOM G6 RECEIVER) DEVI Dispense DexCom G6 Receiver 05/03/20   [provider]  Continuous Blood Gluc Sensor (DEXCOM G6 SENSOR) MISC Dispense DexCom G6 sensors; Use 1 sensor q 10 days 05/03/20   [provider]  Continuous Blood Gluc Sensor (DEXCOM G6 SENSOR) MISC USE 1 SENSOR EVERY 10 DAYS 07/15/20   [provider]  Continuous Blood Gluc Transmit (DEXCOM G6 TRANSMITTER) MISC USE 1 EVERY 90 DAYS 09/07/20   [provider]  cyclobenzaprine (FLEXERIL) 10 MG tablet Take 1 tablet (10 mg total) by mouth 2 (two) times daily as needed for muscle spasms. 05/07/22   Bing Neighbors, NP  diazepam (VALIUM) 5 MG tablet  06/12/20   [provider]  DULoxetine (CYMBALTA) 30 MG capsule Take 90 mg by mouth daily. 11/03/21   [provider]  fluconazole (DIFLUCAN) 150 MG tablet Take 150 mg by mouth daily. Patient not taking: Reported on 06/23/2023    [provider]  fluticasone (FLOVENT HFA) 110 MCG/ACT inhaler Inhale 2 puffs into the lungs in the morning and at bedtime. 10/03/22   Immordino, Jeannett Senior, FNP  gabapentin (NEURONTIN) 300 MG capsule Take 300 mg by mouth 3 (three) times daily.  Patient not taking: Reported on 06/23/2023 11/03/21   [provider]  HUMALOG KWIKPEN 100 UNIT/ML KwikPen SMARTSIG:0-50 Unit(s) SUB-Q 3 Times Daily 10/16/21   [provider]  hydrOXYzine (ATARAX) 50 MG tablet hydroxyzine HCl 50 mg tablet    [provider]  ibuprofen (ADVIL) 800 MG tablet Take 1 tablet (800 mg total) by mouth every 12 (twelve) hours as needed for moderate pain. 05/07/22   Bing Neighbors, NP  ketoconazole (NIZORAL) 2 % shampoo Use few time weekly in shower for scalp or when you get your hair done. Leave in a few minutes then wash out. 09/16/18   [provider]   LANTUS SOLOSTAR 100 UNIT/ML Solostar Pen SMARTSIG:50 Unit(s) SUB-Q Twice Daily 11/03/21   [provider]  lisinopril-hydrochlorothiazide (PRINZIDE,ZESTORETIC) 20-25 MG tablet Take 1 tablet by mouth 2 (two) times daily.    [provider]  meclizine (ANTIVERT) 25 MG tablet Take 1 tablet (25 mg total) by mouth 3 (three) times daily as needed for dizziness. 04/25/19   Jeanmarie Plant, MD  meloxicam (MOBIC) 15 MG tablet Take 1 tablet (15 mg total) by mouth daily. 11/11/21   Rushie Chestnut, PA-C  metFORMIN (GLUCOPHAGE) 500 MG tablet Take 500 mg by mouth 2 (two) times daily. 10/08/21   [provider]  methocarbamol (ROBAXIN) 500 MG tablet Take 1 tablet (500 mg total) by mouth 2 (two) times daily. 11/11/21   Rushie Chestnut, PA-C  METOPROLOL TARTRATE PO Take by mouth 2 (two) times daily.    [provider]  MOUNJARO 5 MG/0.5ML Pen SMARTSIG:5 Milligram(s) SUB-Q Once a Week 08/28/22   [provider]  OZEMPIC, 0.25 OR 0.5 MG/DOSE, 2 MG/1.5ML SOPN Inject into the skin. Patient not taking: Reported on 06/23/2023 11/07/21   [provider]  polyethylene glycol (MIRALAX / GLYCOLAX) 17 g packet Take by mouth. 03/29/16   [provider]  promethazine-dextromethorphan (PROMETHAZINE-DM) 6.25-15 MG/5ML syrup Take 5 mLs by mouth at bedtime as needed for cough. Patient not taking: Reported on 06/23/2023 10/08/22   Carlisle Beers, FNP  spironolactone (ALDACTONE) 50 MG tablet Take 50 mg by mouth daily. 10/08/21   [provider]  STATUS COVID-19/FLU A&B KIT TEST AS DIRECTED TODAY 10/02/22   [provider]  topiramate (TOPAMAX) 25 MG capsule Take 25 mg by mouth 2 (two) times daily.    [provider]  TOUJEO MAX SOLOSTAR 300 UNIT/ML Solostar Pen Inject into the skin.    [provider]  triamcinolone cream (KENALOG) 0.1 % Apply topically. 06/12/20   [provider]    Family History History reviewed. No  pertinent family history.  Social History Social History   Tobacco Use   Smoking status: Every Day    Current packs/day: 0.50    Types: Cigarettes   Smokeless tobacco: Never  Vaping Use   Vaping status: Never Used  Substance Use Topics   Alcohol use: No   Drug use: Never     Allergies   Cashew nut oil, Empagliflozin, Justicia adhatoda (malabar nut tree) [justicia adhatoda], Statins, and Zofran [ondansetron hcl]   Review of Systems Review of Systems  Constitutional:  Negative for chills and fever.  Skin:  Positive for color change and wound.     Physical Exam Triage Vital Signs ED Triage Vitals  Encounter Vitals Group     BP      Systolic BP Percentile      Diastolic BP Percentile      Pulse  Resp      Temp      Temp src      SpO2      Weight      Height      Head Circumference      Peak Flow      Pain Score      Pain Loc      Pain Education      Exclude from Growth Chart    No data found.  Updated Vital Signs BP 139/80   Pulse (!) 115   Temp 99.3 F (37.4 C)   Resp 18   LMP 09/15/2016 (Within Weeks)   SpO2 95%   Visual Acuity Right Eye Distance:   Left Eye Distance:   Bilateral Distance:    Right Eye Near:   Left Eye Near:    Bilateral Near:     Physical Exam Constitutional:      General: She is not in acute distress. HENT:     Mouth/Throat:     Mouth: Mucous membranes are moist.  Cardiovascular:     Rate and Rhythm: Normal rate and regular rhythm.  Pulmonary:     Effort: Pulmonary effort is normal. No respiratory distress.  Skin:    General: Skin is warm and dry.     Findings: Erythema and lesion present.     Comments: 4 cm area of induration with central fluctuant area. No open wound or drainage. Localized erythema.   Neurological:     Mental Status: She is alert.  Psychiatric:        Mood and Affect: Mood normal.        Behavior: Behavior normal.      UC Treatments / Results  Labs (all labs ordered are listed, but  only abnormal results are displayed) Labs Reviewed - No data to display  EKG   Radiology No results found.  Procedures Incision and Drainage  Date/Time: 06/23/2023 12:07 PM  Performed by: Mickie Bail, NP Authorized by: Mickie Bail, NP   Consent:    Consent obtained:  Verbal   Consent given by:  Patient   Risks discussed:  Bleeding, incomplete drainage, pain and infection Universal protocol:    Procedure explained and questions answered to patient or proxy's satisfaction: yes   Location:    Type:  Abscess   Location:  Trunk   Trunk location:  Abdomen Pre-procedure details:    Skin preparation:  Povidone-iodine Anesthesia:    Anesthesia method:  Local infiltration   Local anesthetic:  Lidocaine 1% w/o epi Procedure type:    Complexity:  Simple Procedure details:    Incision types:  Single straight   Drainage:  Purulent   Drainage amount:  Copious   Wound treatment:  Wound left open   Packing materials:  None Post-procedure details:    Procedure completion:  Tolerated well, no immediate complications  (including critical care time)  Medications Ordered in UC Medications - No data to display  Initial Impression / Assessment and Plan / UC Course  I have reviewed the triage vital signs and the nursing notes.  Pertinent labs & imaging results that were available during my care of the patient were reviewed by me and considered in my medical decision making (see chart for details).    Abscess of left upper abdominal wall below the left breast.  I&D performed with copious amount of purulent drainage.  Treating with doxycycline.  Wound care instructions discussed.  Instructed patient to follow-up with her PCP  for recheck of the wound.  Education provided on skin abscess.  She agrees to plan of care.  Final Clinical Impressions(s) / UC Diagnoses   Final diagnoses:  Abscess     Discharge Instructions      Take the doxycycline as directed.  Follow-up with your  primary care provider.     ED Prescriptions     Medication Sig Dispense Auth. Provider   doxycycline (VIBRAMYCIN) 100 MG capsule Take 1 capsule (100 mg total) by mouth 2 (two) times daily for 7 days. 14 capsule Mickie Bail, NP      PDMP not reviewed this encounter.   Mickie Bail, NP 06/23/23 1209

## 2023-06-23 NOTE — Discharge Instructions (Addendum)
Take the doxycycline as directed.  Follow up with your primary care provider.

## 2023-06-23 NOTE — ED Triage Notes (Signed)
Patient to Urgent Care with complaints of  abscess present beneath left breast. Reports area started increasing in size and inflammation over the last week.  No drainage. Has been cleaning the area with alcohol pads.

## 2023-07-07 MED ORDER — MOUNJARO 7.5 MG/0.5 ML SUBCUTANEOUS PEN INJECTOR
SUBCUTANEOUS | 0 refills | 0 days | Status: CP
Start: 2023-07-07 — End: 2023-07-29

## 2023-07-21 NOTE — Unmapped (Signed)
Robin Jimenez requested a refill of their Mounjaro via IVR/Web. The [facility] has scheduled delivery per the patients request via Same Day Courier to be delivered to their prescription address on 07/24/23.

## 2023-07-22 ENCOUNTER — Ambulatory Visit
Admit: 2023-07-22 | Discharge: 2023-07-23 | Payer: PRIVATE HEALTH INSURANCE | Attending: Internal Medicine | Primary: Internal Medicine

## 2023-07-22 DIAGNOSIS — F17200 Nicotine dependence, unspecified, uncomplicated: Principal | ICD-10-CM

## 2023-07-22 DIAGNOSIS — R748 Abnormal levels of other serum enzymes: Principal | ICD-10-CM

## 2023-07-22 DIAGNOSIS — C541 Malignant neoplasm of endometrium: Principal | ICD-10-CM

## 2023-07-22 DIAGNOSIS — Z794 Long term (current) use of insulin: Principal | ICD-10-CM

## 2023-07-22 DIAGNOSIS — K760 Fatty (change of) liver, not elsewhere classified: Principal | ICD-10-CM

## 2023-07-22 DIAGNOSIS — E1165 Type 2 diabetes mellitus with hyperglycemia: Principal | ICD-10-CM

## 2023-07-22 DIAGNOSIS — Z6841 Body Mass Index (BMI) 40.0 and over, adult: Principal | ICD-10-CM

## 2023-07-22 DIAGNOSIS — E559 Vitamin D deficiency, unspecified: Principal | ICD-10-CM

## 2023-07-22 MED ORDER — MOUNJARO 12.5 MG/0.5 ML SUBCUTANEOUS PEN INJECTOR
SUBCUTANEOUS | 0 refills | 0 days | Status: CP
Start: 2023-07-22 — End: ?

## 2023-07-22 MED ORDER — MOUNJARO 10 MG/0.5 ML SUBCUTANEOUS PEN INJECTOR
SUBCUTANEOUS | 0 refills | 0 days | Status: CP
Start: 2023-07-22 — End: ?

## 2023-07-22 NOTE — Unmapped (Signed)
Internal Medicine Clinic Visit    Reason for Visit: Diabetes, hypertension, vitamin D deficiency     A/P:    History of endometrial cancer  Status post TAH/BSO in March 2019. 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  Plan was for close surveillance.  Last seen 12/2019 with plan to follow-up in 6 months.  Scheduled to see Gyn in October.     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 Prior hepatitis B and C testing negative. Declined Hep A vaccination. Ultrasound with hepatomegaly and steatosis. Scheduled to see GI October 3. Treating vitamin D deficiency to see if this helps alkaline phosphatase.     Type 2 diabetes mellitus with hyperglycemia, with long-term current use of insulin (CMS-HCC) / Class 3 severe obesity due to excess calories with serious comorbidity and body mass index (BMI) of 50.0 to 59.9 in adult (CMS-HCC)  Lab Results   Component Value Date    A1C 8.3 (H) 07/22/2023   Not well-controlled.  Recently saw endocrinology.  They recommended she take Toujeo 65 units twice daily but she has been taking 60 units twice daily.  They also recommended Humalog 7 units with meals and she has been doing 60 units with meals.  We discussed the recommendations in detail today and she will make adjustments.  She would like to hold off on starting Topamax until she sees them.  Tolerating Mounjaro and planning to start 7.5 mg soon.  Sent the next 2 months of higher doses to her pharmacy and instructed her to reach out if she is having side effects and we need to slow the titration down.  - tirzepatide (MOUNJARO) 10 mg/0.5 mL PnIj; Inject 10 mg under the skin every seven (7) days.  Dispense: 2 mL; Refill: 0  - tirzepatide (MOUNJARO) 12.5 mg/0.5 mL PnIj; Inject 12.5 mg under the skin every seven (7) days. Start after finishing 1 month of 10 mg dose.  Dispense: 2 mL; Refill: 0  - Continue metformin XR 2 g daily  - Not on statin due to pancreatitis in the past    Vitamin D deficiency  Vitamin D low despite taking 2000 units daily.  Instructed her to increase to 3000 units daily.  Will plan to check a level at her next appointment.    Primary hypertension  Close to being controlled.Shanon Rosser find a reason she should be on metoprolol (no history of arrhythmia or heart failure) so we have been tapering this.  Today she reports she has been taking 25 mg intermittently; instructed her to stop.   - Continue spironolactone 50 mg daily  - Continue amlodipine 5 mg daily  - Continue lisinopril-hydrochlorothiazide 20-25 mg daily  - Will increase lisinopril portion at follow up if still elevated (holding off today as hopeful for weight loss with Mounjaro)     Recurrent major depressive disorder, in full remission (CMS-HCC) / Anxiety  Knows she would benefit from therapist, but does not feel ready.   - DULoxetine (CYMBALTA) 60 MG capsule; Take 2 capsules (120 mg total) by mouth daily.  Dispense: 180 capsule; Refill: 3    Tobacco use disorder  Not interested in options to quit today.     Return in about 3 months (around 10/21/2023). Vitamin D level. Tobacco use. Mammogram.     Healthcare Maintenance  Cancer screening:  Colon: Referral for colonoscopy sent previously; provided number to schedule  Cervical: will address at next appointment  Breast: will address at next visit  Lung:  currently not indicated    Endometrial: Hx of endometrial cancer (2018), overdue for screening, referred to [facility] for follow up    Immunizations:  - Flu shot: Declined  - Pneumonia: UTD  - Shingrix: will address at next visit  - Tdap/Td: up to date, received 2019  - COVID: Declined    Other:  - HCV: screened in 2022, negative  - Lipid panel: Last done 2021 (can't do statin)   __________________________________________________________    HPI:    52 year old with a history of type 2 diabetes, hypertension, obesity, tobacco use disorder, anxiety, pancreatitis, abnormal pap smears, endometrial cancer who presents today to follow up.     Diabetes: Currently Toujeo 60 units twice daily. Also doing 60 units Humalog with meals. (Supposed to be doing 65 BID of Toujeo as well as 70 units with meals). She just took the last dose of Mounjaro 5 mg on Sunday. Waiting to start the 7.5.     Obesity: Endocrine started her on topamax as well. She was concerned about starting it. Not yet lost weight with Mounjaro.     Taking 2000 units Vitamin D daily. No missed doses.     Scheduled to see GI October 3.     Still smoking but motivated to quit. Not interested in therapeutic options today.   __________________________________________________________    Problem List:  Patient Active Problem List   Diagnosis    Type 2 diabetes mellitus with diabetic polyneuropathy, with long-term current use of insulin (CMS-HCC)    Primary hypertension    Class 3 severe obesity due to excess calories with serious comorbidity and body mass index (BMI) of 50.0 to 59.9 in adult (CMS-HCC)    LGSIL on Pap smear of cervix    Hypercholesterolemia    History of asthma    Tobacco use disorder    Pancreatitis    Tinnitus of both ears    Pain and swelling of left lower leg    Diabetic polyneuropathy associated with type 2 diabetes mellitus (CMS-HCC)    Anxiety    Endometrial cancer (CMS-HCC)    Metabolic dysfunction-associated steatotic liver disease (MASLD)    Hx of pancreatitis    Granuloma annulare    Vitreous hemorrhage, right eye (CMS-HCC)       Medications:  Reviewed in EPIC  __________________________________________________________    Physical Exam:   Vital Signs:  Vitals:    07/22/23 0801   BP: 132/87   BP Site: L Arm   BP Position: Sitting   BP Cuff Size: Large   Pulse: 89   Resp: 17   Temp: 36.2 ??C (97.2 ??F)   TempSrc: Temporal   SpO2: 98%   Weight: (!) 176 kg (388 lb)   Height: 175.3 cm (5' 9)        Body mass index is 57.3 kg/m??.    Gen: Well appearing, NAD

## 2023-07-22 NOTE — Unmapped (Addendum)
Toujeo - This is your long-acting insulin. You should take 65 units twice daily.   Humalog - This is your meal time insulin. You should be taking 70 units with meals.     I would call the pharmacy to make sure they are shipping Mounjaro 7.5 to you. After 1 month of 7.5 Mounjaro, increase to 10 mg weekly.     Stop metoprolol.     Increase vitamin D to 3000 units daily.

## 2023-08-07 ENCOUNTER — Ambulatory Visit: Admit: 2023-08-07 | Discharge: 2023-08-08 | Payer: PRIVATE HEALTH INSURANCE

## 2023-08-07 DIAGNOSIS — K7460 Unspecified cirrhosis of liver: Principal | ICD-10-CM

## 2023-08-07 DIAGNOSIS — K760 Fatty (change of) liver, not elsewhere classified: Principal | ICD-10-CM

## 2023-08-07 DIAGNOSIS — R748 Abnormal levels of other serum enzymes: Principal | ICD-10-CM

## 2023-08-07 LAB — FERRITIN: FERRITIN: 90 ng/mL

## 2023-08-07 LAB — PROTIME-INR
INR: 1.01
PROTIME: 11.3 s (ref 9.9–12.6)

## 2023-08-07 LAB — IGG: GAMMAGLOBULIN; IGG: 1399 mg/dL (ref 650–1600)

## 2023-08-07 LAB — IRON & TIBC
IRON SATURATION: 25 % (ref 20–55)
IRON: 83 ug/dL
TOTAL IRON BINDING CAPACITY: 336 ug/dL (ref 250–425)

## 2023-08-07 LAB — CERULOPLASMIN: CERULOPLASMIN: 34 mg/dL (ref 15.0–52.0)

## 2023-08-07 NOTE — Unmapped (Signed)
[facility]  [facility]    FIBROSCAN will be performed to assess hepatic fibrosis (scarring) in order to stage this patient's liver disease. This will assist with evaluating the natural course of the disease and will provide important information regarding prognosis, duration of therapy, and potential response to treatment. This information will also help assess risk for hepatocellular carcinoma and need for liver cancer surveillance.    FibroscanProcedure:  After obtaining verbal consent, the patient was placed in a supine position. Physical characteristics, body habitus, and landmarks were assessed to establish appropriate midaxillary intercostal space for probe placement.    FibroScan 630 Expert Serial # [id number]    Probe  []  M+ Serial # [id number]  [x]  XL+ Serial # [id number]    Main Etiology of Liver Disease:  [] Hepatitis C (HCV)     [] Hepatitis B (HBV)  [] Alcohol  [] NAFLD     [] PBC       [] PSC  [] Autoimmune Hepatitis       [] Elevated hepatic enzymes       [x] Other                                    Skin to liver capsule distance and liver parenchyma were accessed during the entire examination with the FibroScan probe. The patient was instructed to breathe normally and to abstain from sudden movements during the procedure, allowing for random measurements of liver stiffness. 50Hz  shear wave pulses were applied and the resulting shear wave and propagation speed detected with a 3.5 MHz ultrasonic signal, using the FibroScan probe. At least ten Shear Waves were produced; individual measurements of each shear wave were calculated. Patient tolerated the procedure well and was discharged without incident.    FibroScan score __30.3__ kPa   IQR/Med __26__ %   CAP __383__ dB/m     Test performed by: Jackelyn Hoehn    --------------------------------------------------------------------  RESULTS REPORT - Physician???s interpretation    Estimation of the stage of liver fibrosis (Metavir Score): The results of the Liver Stiffness Score are consistent with the following liver fibrosis stage:    []  F0-F1  []  F2   []  F3   [x] F4           GENERAL RECOMMENDATIONS ACCORDING TO THE STAGE OF LIVER FIBROSIS.    F0-F1: No-minimal fibrosis. The risk of progression to advanced fibrosis and cirrhosis is low. If the cause of liver disease is not removed, a 1-2 yr follow-up study is recommended. F2: Significant fibrosis. There is a moderate risk of progression to cirrhosis. If the cause of liver disease is not removed, a follow-up study in 12 months is recommended.  F3: Advanced (pre-cirrhotic stage). The risk of progression to cirrhosis is high. Imaging studies to rule out hepatocellular carcinoma should be considered. Efforts to remove the cause of liver disease are highly recommended.  F4: Cirrhosis. There is significant risk of portal hypertension and esophageal varices. An upper endoscopy is recommended. Imaging studies for hepatocellular carcinoma screening are recommended.    Any and all FibroScan studies must be carefully evaluated, taking fully into account all individual measurement/scans, patient history and other factors. As with liver biopsy, any estimation of liver fibrosis may be subject to under or over staging due to sampling error. Any further medical or surgical intervention should be made only while fully considering the circumstances of this patient and in consultation with this patient.  I have reviewed and interpreted the FIBROSCAN test results as described above.     F 4 fibrosis  S 3 (>66%) steatosis    A. Yolonda Kida, MD, [facility]  Professor of Medicine  Division of Gastroenterology and Hepatology  [facility] [place]  [address]  [id number]  [place], [place] [address]    Phone [phone]  Fax [phone]

## 2023-08-07 NOTE — Unmapped (Signed)
--  labs today   --hep a and b vaccinations  --[place] due in December  --egd ordered for variceal screening  --nutrition referral   --avoid ultra processsed foods  --continue to work on weight loss  --    Rtc in 3 months

## 2023-08-07 NOTE — Unmapped (Signed)
[facility]  08/07/2023    Reason for visit: New consultation requested by Johnsie Cancel, * for evaluation of No primary diagnosis found.    Assessment/Plan:    52 y.o. 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 Will rule out other causes of CLD today. Discussed today that it is Possible that steatosis / elevation liver enzymes are making liver appear stiffer than actually is, but she likely has some degree of advanced fibrosis if not cirrhosis. We discussed considering liver biopsy/ MRE MRI for more definitive assesment of fibrosis. In the interim, will work on weight loss and treat as compensated cirrhosis with [facility] screening/ variceal screening.   --consider liver biopsy or MRE   MASLD/MASH: discussed treatment is weight loss with diet/ exercise/ control of metabolic risk factors. Is on Mounjaro which is beneficial with SLD  --referral to [facility]  Varices: no prior bleeding. Last EGD 2019 without varices. Given new dx and LSM 30 kpa, will get EGD and discuss carvedilol if e/o CSPH  - EGD   HCC: q54month screening discussed.   - Next due 10/05/2023. Ordered   Transplant: MELD-, A POS. compensated  Health maintenance:   Vaccines: HAV vaccinated, HBV vaccine # 1 today, influenza (annual); PCV20 2023 (or PCV13 followed by PPSV23 1 year later and again after 5 years); Shingrix (age 60+); Tdap; SARS-CoV-2.  Colon cancer: colonoscopy  ordered by PCP 2023--> discuss getting done next visit     Return in about 3 months (around 11/07/2023).    Subjective   History of Present Illness   Accompanied by: N/A (unaccompanied)    52 y.o. female with elevated liver enzymes and hepatic steatosis/ HM. On Mounjaro which has decreased her appetite. No sig weight loss yet. Was seeing weight management, but could not stop smoking to get sx.     PMH: HTN, DM II, HLD, obesity . Has been overweight most of her adult life.   No jaundice, encephalopathy, ascites or GI bleeding.   No hx of excessive alcohol.   No alcohol currently    On diuretics for LE edema?   Reports Some RUQ.       PMH: HTN, DM II, HLD, obesity   FH: no liver disease  SH: no illicit drugs or alcohol use, not maarried no children, works as Clinical biochemist Rep for [employer], smoking 1/2 ppd     Past Medical History:   Diagnosis Date    Abnormal Pap smear of cervix 2009    Anxiety 11/28/2016    Asthma     prn inhaler use    Backache 12/28/2011    MSK - ibuprofen, tylenol, heat/ice, stretching and physical activity     Diabetes mellitus (CMS-HCC)     Diabetic polyneuropathy associated with type 2 diabetes mellitus (CMS-HCC) 06/26/2016    Edema extremities 09/09/2015    Endometrial cancer (CMS-HCC) 09/02/2017    Fatty liver     Fatty liver     Genital herpes     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 Seen by surgery - no e/o gallstones or indication for surgery.  Recurrence summer 2018 - presumed due to atorvastatin in the past. Recurrence on pravastatin therefore statin d/c'd    Pneumonia 03/2017    Tinnitus of both ears 04/13/2015    Tobacco use disorder 01/26/2011    actively smoking, not ready to quit     Type 2 diabetes mellitus with diabetic polyneuropathy, with long-term current use of insulin (CMS-HCC) 12/30/2006      Past Surgical History:   Procedure Laterality Date    COLPOSCOPY  age 78, 39    FOOT SURGERY  1997    bone removed little toe    HYSTERECTOMY      LIPOMA RESECTION  2012    [facility]    OOPHORECTOMY      PR DILATION/CURETTAGE,DIAGNOSTIC N/A 08/14/2017    Procedure: DILATION AND CURETTAGE, DIAGNOSTIC AND/OR THERAPEUTIC (NON OBSTETRICAL);  Surgeon: Estil Daft, MD;  Location: [facility] OR [facility];  Service: [facility] Primary Gynecology    PR DILATION/CURETTAGE,DIAGNOSTIC Midline 09/10/2017    Procedure: DILATION AND CURETTAGE, DIAGNOSTIC AND/OR THERAPEUTIC (NON OBSTETRICAL);  Surgeon: Dossie Der, MD;  Location: MAIN OR [facility];  Service: Gynecology Oncology    PR HYSTEROSCOPY,DX,SEP PROC Midline 08/14/2017    Procedure: HYSTEROSCOPY, DIAGNOSTIC (SEPARATE PROCEDURE);  Surgeon: Estil Daft, MD;  Location: [facility] OR [facility];  Service: [facility] Primary Gynecology    PR INSERT INTRAUTERINE DEVICE Midline 09/10/2017    Procedure: INSERTION OF INTRAUTERINE DEVICE (IUD);  Surgeon: Dossie Der, MD;  Location: MAIN OR [facility];  Service: Gynecology Oncology    PR INTRAOPERATIVE SENTINEL LYMPH NODE ID W DYE INJECTION N/A 01/07/2018    Procedure: INTRAOPERATIVE IDENTIFICATION SENTINEL LYMPH NODE(S) INCLUDE INJECTION NON-RADIOACTIVE DYE, WHEN PERFORMED;  Surgeon: Dossie Der, MD;  Location: MAIN OR [facility];  Service: Gynecology Oncology    PR LAPAROSCOPY TOT HYSTERECTOMY UTERUS >250 GRAM W TUBE/OVARY Bilateral 01/07/2018    Procedure: ROBOTIC XI LAPAROSCOPY, SURGICAL, WITH TOTAL HYSTERECTOMY, UTERUS GREATER THAN 250 G, W/REMOVAL TUBE(S) &/OR OVARY(S);  Surgeon: Dossie Der, MD;  Location: MAIN OR [facility];  Service: Gynecology Oncology    PR REMOVE PELVIS LYMPH NODES Left 01/07/2018    Procedure: Robotic Xi Pelvic Lymphadenectomy W/Ext Iliac (Separt Proc);  Surgeon: Dossie Der, MD;  Location: MAIN OR [facility];  Service: Gynecology Oncology    PR UPPER GI ENDOSCOPY,BIOPSY N/A 04/08/2018    Procedure: UGI ENDOSCOPY; WITH BIOPSY, SINGLE OR MULTIPLE;  Surgeon: Janyth Pupa, MD;  [facility];  Service: Gastroenterology      Objective   Physical Exam Vital Signs: BP 139/76  - Pulse 114  - Temp 36.3 ??C (97.4 ??F)  - Ht 175.3 cm (5' 9.02)  - Wt (!) 174 kg (383 lb 9.6 oz)  - LMP  (LMP Unknown)  - SpO2 96%  - BMI 56.62 kg/m??   Constitutional: She is in no apparent distress.  Eyes: Anicteric sclerae  Cardiovascular: some peripheral edema  Gastrointestinal: Soft, nontender abdomen   Neurologic: Awake, alert, and oriented to person, place, and time with normal speech and no asterixis    Studies:   US Abdomen 05/02/2023:   Impression   No gallstones or biliary dilatation.   Hepatomegaly with steatosis   EGD 2019:  no varices, no PHG.   Fibroscan 08/07/2023: lsm  30.3 kpa, 383 CAP (F4, S3)                                                        Results for orders placed or performed in visit on 08/07/23   PT-INR   Result Value Ref Range    PT 11.3 9.9 - 12.6 sec    INR 1.01    Ceruloplasmin   Result Value Ref Range    Ceruloplasmin 34.0 15.0 - 52.0 mg/dL   IgM   Result Value Ref Range    IgM 88 40 - 230 mg/dL   Antimitochondrial Antibody   Result Value Ref Range    Anti Mitochondrial Ab Negative Negative    Anti-Mitochondrial Level 5.9    IgG   Result Value Ref Range    Total IgG 1,399 650 - 1,600 mg/dL   Anti-Nuclear Antibody (ANA)   Result Value Ref Range    Antinuclear Antibodies (ANA) Negative Negative   Ferritin   Result Value Ref Range    Ferritin 90.0 7.3 - 270.7 ng/mL   Iron & TIBC   Result Value Ref Range    Iron 83 50 - 170 ug/dL    TIBC 161 096 - 045 ug/dL    Iron Saturation (%) 25 20 - 55 %       I personally spent 70 minutes face-to-face and non-face-to-face in the care of this patient, which includes all pre, intra, and post visit time on the date of service

## 2023-08-08 LAB — ANTIMITOCHONDRIAL ANTIBODY
ANTI-MITOCHONDRIAL ANTIBODY: NEGATIVE
ANTI-MITOCHONDRIAL LEVEL: 5.9

## 2023-08-08 LAB — IGM: GAMMAGLOBULIN; IGM: 88 mg/dL (ref 40–230)

## 2023-08-08 LAB — ANA: ANTINUCLEAR ANTIBODIES (ANA): NEGATIVE

## 2023-08-11 LAB — ALPHA-1-ANTITRYPSIN: ALPHA-1-ANTITRYPSIN, S: 149 mg/dL

## 2023-08-11 LAB — ANTI-SMOOTH MUSCLE ANTIBODY, IGG: SMOOTH MUSCLE ANTIBODY: NEGATIVE

## 2023-08-11 NOTE — Unmapped (Signed)
EGD  Procedure #1     Procedure #2   [id number]  MRN   hepatologist  Endoscopist     Is the patient's health insurance [employer], [employer], [place] [facility]), [employer] [place], [employer], or [place]?     Urgent procedure     Are you pregnant?     Are you in the process of scheduling or awaiting results of a heart ultrasound, stress test, or catheterization to evaluate new or worsening chest pain, dizziness, or shortness of breath?     Do you take: Plavix (clopidogrel), Coumadin (warfarin), Lovenox (enoxaparin), Pradaxa (dabigatran), Effient (prasugrel), Xarelto (rivaroxaban), Eliquis (apixaban), Pletal (cilostazol), or Brilinta (ticagrelor)?          Did ordering provider indicate how long to hold this medication in the order comments?          Which of the above medications are you taking?          What is the name of the medical practice that manages this medication?          What is the name of the medical provider who manages this medication?     Do you have hemophilia, von Willebrand disease, or low platelets?     Do you have a pacemaker or implanted cardiac defibrillator?     Has a [facility] provider specified the location(s)?     Which location(s) did the [facility] provider specify?          [facility]          [place]          [facility]   TRUE  Do you see a liver specialist for chronic liver disease?     Is the procedure indication for variceal screening?     Is procedure indication for variceal banding (this does NOT include variceal screening)?     Have you had a heart attack, stroke or heart stent placement within the past 6 months?     Month of event     Year of event (ONLY ENTER LAST 2 DIGITS)        5  Height (feet)   9  Height (inches)   383  Weight (pounds)   56.6  BMI          Did the ordering provider specify a bowel prep?          What bowel prep was specified?     Do you have an ostomy (bag on your stomach that collects your stool)?          Is it an ileostomy?          Is it a colostomy?          Patient doesn't know.     Do you have chronic kidney disease?     Do you have chronic constipation or have you had poor quality bowel preps for past colonoscopies?     Do you have Crohn's disease or ulcerative colitis?     Have you had weight loss surgery?          When you walk around your house or grocery store, do you have to stop and rest due to shortness of breath, chest pain, or light-headedness?     Do you ever use supplemental oxygen?     Have you been hospitalized for cirrhosis of the liver or heart failure in the last 12 months?     Have you been treated for mouth or throat  cancer with radiation or surgery?     Have you been told that it is difficult for doctors to insert a breathing tube in you during anesthesia?     Have you had a heart or lung transplant?          Are you on dialysis?     Do you have cirrhosis of the liver?     Do you have myasthenia gravis?     Is the patient a prisoner?   ################# ## ###################################################################################################################   MRN:  [id number]   Anticoag Review  No   Nurse Triage  No   GI clinic consult  No   Procedure(s):  EGD     0   Endoscopist:  hepatologist   Urgent:  No   Prep:                    --------------------------- --- ----------------------------------------------------------------------------------------------------------------------------------------------------------------------------   [facility]:  [facility]                  Requested Locations:              ################# ## ###################################################################################################################

## 2023-08-12 LAB — PHOSPHATIDYLETHANOL (PETH)
PETH 16:0/18:1 (POPETH) BY LC-MS/MS: <10 ng/mL
PETH 16:0/18:2 (PLPETH) BY LC-MS/MS: <10 ng/mL
PETH INTERPRETATION: NEGATIVE

## 2023-08-19 ENCOUNTER — Encounter
Admit: 2023-08-19 | Discharge: 2023-08-23 | Payer: PRIVATE HEALTH INSURANCE | Attending: Anesthesiology | Primary: Anesthesiology

## 2023-08-19 ENCOUNTER — Ambulatory Visit: Admit: 2023-08-19 | Discharge: 2023-08-23 | Payer: PRIVATE HEALTH INSURANCE

## 2023-08-19 MED ADMIN — lidocaine (PF) (XYLOCAINE-MPF) 20 mg/mL (2 %) injection: INTRAVENOUS | @ 15:00:00 | Stop: 2023-08-19

## 2023-08-19 MED ADMIN — dexmedeTOMIDine (Precedex) injection: INTRAVENOUS | @ 15:00:00 | Stop: 2023-08-19

## 2023-08-19 MED ADMIN — Propofol (DIPRIVAN) injection: INTRAVENOUS | @ 15:00:00 | Stop: 2023-08-19

## 2023-08-19 MED ADMIN — scopolamine (TRANSDERM-SCOP) 1 mg over 3 days topical patch 1 mg: 1 | TOPICAL | @ 16:00:00

## 2023-08-19 NOTE — Unmapped (Signed)
Addendum  created 08/19/23 1150 by Berton Lan, MD    Clinical Note Signed

## 2023-09-01 NOTE — Unmapped (Signed)
Specialty Medication(s): [facility]    Robin Jimenez has been dis-enrolled from the [facility] specialty pharmacy services due to a pharmacy change. The patient is now filling at [facility] .    Additional information provided to the patient: n/a    Arnold Long, PharmD  [facility] Specialty Pharmacist

## 2023-09-26 ENCOUNTER — Ambulatory Visit
Admit: 2023-09-26 | Discharge: 2023-09-27 | Payer: PRIVATE HEALTH INSURANCE | Attending: Registered" | Primary: Registered"

## 2023-09-26 DIAGNOSIS — K7460 Unspecified cirrhosis of liver: Principal | ICD-10-CM

## 2023-09-27 MED ORDER — EZETIMIBE 10 MG TABLET
ORAL_TABLET | Freq: Every day | ORAL | 3 refills | 90 days
Start: 2023-09-27 — End: 2024-09-26

## 2023-09-27 MED ORDER — DAPAGLIFLOZIN PROPANEDIOL 5 MG TABLET
ORAL_TABLET | Freq: Every morning | ORAL | 3 refills | 30 days
Start: 2023-09-27 — End: 2024-09-26

## 2023-09-30 ENCOUNTER — Ambulatory Visit: Admit: 2023-09-30 | Discharge: 2023-09-30 | Payer: PRIVATE HEALTH INSURANCE

## 2023-09-30 DIAGNOSIS — I10 Essential (primary) hypertension: Principal | ICD-10-CM

## 2023-09-30 DIAGNOSIS — R7989 Other specified abnormal findings of blood chemistry: Principal | ICD-10-CM

## 2023-09-30 DIAGNOSIS — Z6841 Body Mass Index (BMI) 40.0 and over, adult: Principal | ICD-10-CM

## 2023-09-30 DIAGNOSIS — K7581 Nonalcoholic steatohepatitis (NASH): Principal | ICD-10-CM

## 2023-09-30 DIAGNOSIS — Z794 Long term (current) use of insulin: Principal | ICD-10-CM

## 2023-09-30 DIAGNOSIS — K7460 Unspecified cirrhosis of liver: Principal | ICD-10-CM

## 2023-09-30 DIAGNOSIS — E1142 Type 2 diabetes mellitus with diabetic polyneuropathy: Principal | ICD-10-CM

## 2023-09-30 MED ORDER — MOUNJARO 12.5 MG/0.5 ML SUBCUTANEOUS PEN INJECTOR
1 refills | 0 days | Status: CP
Start: 2023-09-30 — End: ?

## 2023-09-30 MED ORDER — DAPAGLIFLOZIN PROPANEDIOL 5 MG TABLET
ORAL_TABLET | Freq: Every morning | ORAL | 3 refills | 30 days | Status: CN
Start: 2023-09-30 — End: 2024-09-29

## 2023-09-30 MED ORDER — MOUNJARO 15 MG/0.5 ML SUBCUTANEOUS PEN INJECTOR
SUBCUTANEOUS | 3 refills | 0 days | Status: CP
Start: 2023-09-30 — End: ?

## 2023-09-30 MED ORDER — EZETIMIBE 10 MG TABLET
ORAL_TABLET | Freq: Every day | ORAL | 3 refills | 90 days | Status: CN
Start: 2023-09-30 — End: 2024-09-29

## 2023-09-30 MED ORDER — REPATHA SURECLICK 140 MG/ML SUBCUTANEOUS PEN INJECTOR
SUBCUTANEOUS | 6 refills | 0 days | Status: CP
Start: 2023-09-30 — End: ?

## 2023-11-11 ENCOUNTER — Ambulatory Visit: Admit: 2023-11-11 | Discharge: 2023-11-12 | Payer: PRIVATE HEALTH INSURANCE

## 2023-11-12 DIAGNOSIS — I10 Essential (primary) hypertension: Principal | ICD-10-CM

## 2023-11-12 MED ORDER — LISINOPRIL 20 MG-HYDROCHLOROTHIAZIDE 25 MG TABLET
ORAL_TABLET | Freq: Every day | ORAL | 0 refills | 327 days | Status: CP
Start: 2023-11-12 — End: 2024-10-04

## 2023-11-28 ENCOUNTER — Ambulatory Visit
Admit: 2023-11-28 | Discharge: 2023-11-29 | Payer: PRIVATE HEALTH INSURANCE | Attending: Internal Medicine | Primary: Internal Medicine

## 2023-11-28 DIAGNOSIS — Z1231 Encounter for screening mammogram for malignant neoplasm of breast: Principal | ICD-10-CM

## 2023-11-28 DIAGNOSIS — E559 Vitamin D deficiency, unspecified: Principal | ICD-10-CM

## 2023-11-28 DIAGNOSIS — E1165 Type 2 diabetes mellitus with hyperglycemia: Principal | ICD-10-CM

## 2023-11-28 DIAGNOSIS — Z1211 Encounter for screening for malignant neoplasm of colon: Principal | ICD-10-CM

## 2023-11-28 DIAGNOSIS — I10 Essential (primary) hypertension: Principal | ICD-10-CM

## 2023-11-28 DIAGNOSIS — Z794 Long term (current) use of insulin: Principal | ICD-10-CM

## 2023-11-28 MED ORDER — OLMESARTAN 40 MG-HYDROCHLOROTHIAZIDE 25 MG TABLET
ORAL_TABLET | Freq: Every day | ORAL | 11 refills | 30.00 days | Status: CP
Start: 2023-11-28 — End: ?

## 2023-11-28 MED ORDER — AMLODIPINE 5 MG TABLET
ORAL_TABLET | Freq: Every day | ORAL | 11 refills | 30 days | Status: CP
Start: 2023-11-28 — End: ?

## 2023-11-28 MED ORDER — SPIRONOLACTONE 50 MG TABLET
ORAL_TABLET | Freq: Every day | ORAL | 11 refills | 30.00 days | Status: CP
Start: 2023-11-28 — End: ?

## 2023-12-03 ENCOUNTER — Inpatient Hospital Stay: Admit: 2023-12-03 | Discharge: 2023-12-04 | Payer: PRIVATE HEALTH INSURANCE

## 2023-12-03 ENCOUNTER — Ambulatory Visit: Admit: 2023-12-03 | Discharge: 2023-12-04 | Payer: PRIVATE HEALTH INSURANCE

## 2023-12-05 ENCOUNTER — Ambulatory Visit: Admit: 2023-12-05 | Discharge: 2023-12-06 | Payer: PRIVATE HEALTH INSURANCE

## 2023-12-05 DIAGNOSIS — E66813 Class 3 severe obesity due to excess calories with serious comorbidity and body mass index (BMI) of 50.0 to 59.9 in adult (CMS-HCC): Principal | ICD-10-CM

## 2023-12-05 DIAGNOSIS — K7469 Other cirrhosis of liver: Principal | ICD-10-CM

## 2023-12-05 DIAGNOSIS — Z794 Long term (current) use of insulin: Principal | ICD-10-CM

## 2023-12-05 DIAGNOSIS — E1169 Type 2 diabetes mellitus with other specified complication: Principal | ICD-10-CM

## 2023-12-05 DIAGNOSIS — I152 Hypertension secondary to endocrine disorders: Principal | ICD-10-CM

## 2023-12-05 DIAGNOSIS — E113592 Type 2 diabetes mellitus with proliferative diabetic retinopathy without macular edema, left eye: Principal | ICD-10-CM

## 2023-12-05 DIAGNOSIS — Z01818 Encounter for other preprocedural examination: Principal | ICD-10-CM

## 2023-12-05 DIAGNOSIS — Z6841 Body Mass Index (BMI) 40.0 and over, adult: Principal | ICD-10-CM

## 2023-12-05 DIAGNOSIS — E1159 Type 2 diabetes mellitus with other circulatory complications: Principal | ICD-10-CM

## 2023-12-05 DIAGNOSIS — E785 Hyperlipidemia, unspecified: Principal | ICD-10-CM

## 2023-12-08 ENCOUNTER — Encounter
Admit: 2023-12-08 | Discharge: 2023-12-08 | Payer: PRIVATE HEALTH INSURANCE | Attending: Student in an Organized Health Care Education/Training Program | Primary: Student in an Organized Health Care Education/Training Program

## 2023-12-08 ENCOUNTER — Inpatient Hospital Stay: Admit: 2023-12-08 | Discharge: 2023-12-08 | Payer: PRIVATE HEALTH INSURANCE

## 2023-12-08 MED ORDER — PREDNISOLONE ACETATE 1 % EYE DROPS,SUSPENSION
Freq: Four times a day (QID) | OPHTHALMIC | 1 refills | 25.00000 days | Status: CP
Start: 2023-12-08 — End: ?

## 2023-12-09 ENCOUNTER — Ambulatory Visit: Admit: 2023-12-09 | Discharge: 2023-12-10 | Payer: PRIVATE HEALTH INSURANCE

## 2023-12-09 DIAGNOSIS — H4311 Vitreous hemorrhage, right eye: Principal | ICD-10-CM

## 2023-12-16 ENCOUNTER — Ambulatory Visit: Admit: 2023-12-16 | Discharge: 2023-12-17 | Payer: PRIVATE HEALTH INSURANCE

## 2023-12-16 DIAGNOSIS — H269 Unspecified cataract: Principal | ICD-10-CM

## 2023-12-16 DIAGNOSIS — H43393 Other vitreous opacities, bilateral: Principal | ICD-10-CM

## 2023-12-16 DIAGNOSIS — H4311 Vitreous hemorrhage, right eye: Principal | ICD-10-CM

## 2024-01-07 DIAGNOSIS — K7460 Unspecified cirrhosis of liver: Principal | ICD-10-CM

## 2024-01-27 ENCOUNTER — Ambulatory Visit: Admit: 2024-01-27 | Discharge: 2024-01-28 | Payer: PRIVATE HEALTH INSURANCE

## 2024-01-27 DIAGNOSIS — H4311 Vitreous hemorrhage, right eye: Principal | ICD-10-CM

## 2024-02-24 ENCOUNTER — Ambulatory Visit: Admit: 2024-02-24 | Discharge: 2024-02-25 | Payer: PRIVATE HEALTH INSURANCE

## 2024-03-03 ENCOUNTER — Ambulatory Visit
Admit: 2024-03-03 | Discharge: 2024-03-04 | Payer: PRIVATE HEALTH INSURANCE | Attending: Internal Medicine | Primary: Internal Medicine

## 2024-03-03 DIAGNOSIS — E113592 Type 2 diabetes mellitus with proliferative diabetic retinopathy without macular edema, left eye: Principal | ICD-10-CM

## 2024-03-03 DIAGNOSIS — Z1211 Encounter for screening for malignant neoplasm of colon: Principal | ICD-10-CM

## 2024-03-03 DIAGNOSIS — E66813 Class 3 severe obesity due to excess calories with serious comorbidity and body mass index (BMI) of 50.0 to 59.9 in adult: Principal | ICD-10-CM

## 2024-03-03 DIAGNOSIS — Z794 Long term (current) use of insulin: Principal | ICD-10-CM

## 2024-03-03 DIAGNOSIS — E1159 Type 2 diabetes mellitus with other circulatory complications: Principal | ICD-10-CM

## 2024-03-03 DIAGNOSIS — M5442 Lumbago with sciatica, left side: Principal | ICD-10-CM

## 2024-03-03 DIAGNOSIS — Z1231 Encounter for screening mammogram for malignant neoplasm of breast: Principal | ICD-10-CM

## 2024-03-03 DIAGNOSIS — E559 Vitamin D deficiency, unspecified: Principal | ICD-10-CM

## 2024-03-03 DIAGNOSIS — C541 Malignant neoplasm of endometrium: Principal | ICD-10-CM

## 2024-03-03 DIAGNOSIS — G8929 Other chronic pain: Principal | ICD-10-CM

## 2024-03-03 DIAGNOSIS — I152 Hypertension secondary to endocrine disorders: Principal | ICD-10-CM

## 2024-03-03 DIAGNOSIS — F17200 Nicotine dependence, unspecified, uncomplicated: Principal | ICD-10-CM

## 2024-03-03 DIAGNOSIS — Z6841 Body Mass Index (BMI) 40.0 and over, adult: Principal | ICD-10-CM

## 2024-03-03 MED ORDER — IBUPROFEN 800 MG TABLET
ORAL_TABLET | ORAL | 2 refills | 0.00000 days | Status: CP
Start: 2024-03-03 — End: ?

## 2024-03-16 DIAGNOSIS — R42 Dizziness and giddiness: Principal | ICD-10-CM

## 2024-03-18 ENCOUNTER — Ambulatory Visit
Admit: 2024-03-18 | Discharge: 2024-03-18 | Payer: PRIVATE HEALTH INSURANCE | Attending: Audiologist | Primary: Audiologist

## 2024-03-18 ENCOUNTER — Ambulatory Visit: Admit: 2024-03-18 | Discharge: 2024-03-18 | Payer: PRIVATE HEALTH INSURANCE

## 2024-03-18 DIAGNOSIS — Z87891 Personal history of nicotine dependence: Principal | ICD-10-CM

## 2024-03-18 DIAGNOSIS — H903 Sensorineural hearing loss, bilateral: Principal | ICD-10-CM

## 2024-03-18 DIAGNOSIS — R2689 Other abnormalities of gait and mobility: Principal | ICD-10-CM

## 2024-03-18 DIAGNOSIS — K219 Gastro-esophageal reflux disease without esophagitis: Principal | ICD-10-CM

## 2024-03-18 DIAGNOSIS — H93A3 Pulsatile tinnitus, bilateral: Principal | ICD-10-CM

## 2024-03-18 DIAGNOSIS — Z6841 Body Mass Index (BMI) 40.0 and over, adult: Principal | ICD-10-CM

## 2024-03-26 ENCOUNTER — Ambulatory Visit: Admit: 2024-03-26 | Discharge: 2024-03-27 | Payer: PRIVATE HEALTH INSURANCE

## 2024-03-26 DIAGNOSIS — R2689 Other abnormalities of gait and mobility: Principal | ICD-10-CM

## 2024-03-30 ENCOUNTER — Ambulatory Visit: Admit: 2024-03-30 | Discharge: 2024-03-31 | Payer: PRIVATE HEALTH INSURANCE

## 2024-03-30 DIAGNOSIS — K7581 Nonalcoholic steatohepatitis (NASH): Principal | ICD-10-CM

## 2024-04-02 DIAGNOSIS — R7989 Other specified abnormal findings of blood chemistry: Principal | ICD-10-CM

## 2024-04-02 DIAGNOSIS — Z6841 Body Mass Index (BMI) 40.0 and over, adult: Principal | ICD-10-CM

## 2024-04-02 DIAGNOSIS — I10 Essential (primary) hypertension: Principal | ICD-10-CM

## 2024-04-02 DIAGNOSIS — Z794 Long term (current) use of insulin: Principal | ICD-10-CM

## 2024-04-02 DIAGNOSIS — E1142 Type 2 diabetes mellitus with diabetic polyneuropathy: Principal | ICD-10-CM

## 2024-04-02 MED ORDER — MOUNJARO 15 MG/0.5 ML SUBCUTANEOUS PEN INJECTOR
SUBCUTANEOUS | 0 refills | 0.00000 days | Status: CP
Start: 2024-04-02 — End: ?

## 2024-04-20 DIAGNOSIS — H40003 Preglaucoma, unspecified, bilateral: Principal | ICD-10-CM

## 2024-04-20 DIAGNOSIS — E113592 Type 2 diabetes mellitus with proliferative diabetic retinopathy without macular edema, left eye: Principal | ICD-10-CM

## 2024-04-20 DIAGNOSIS — H4311 Vitreous hemorrhage, right eye: Principal | ICD-10-CM

## 2024-04-23 MED ORDER — INSULIN LISPRO (U-200) 200 UNIT/ML (3 ML) SUBCUTANEOUS PEN
3 refills | 0.00000 days | Status: CP
Start: 2024-04-23 — End: ?

## 2024-04-26 DIAGNOSIS — F3342 Major depressive disorder, recurrent, in full remission: Principal | ICD-10-CM

## 2024-04-26 DIAGNOSIS — F418 Other specified anxiety disorders: Principal | ICD-10-CM

## 2024-04-26 MED ORDER — DULOXETINE 60 MG CAPSULE,DELAYED RELEASE
ORAL_CAPSULE | Freq: Every day | ORAL | 3 refills | 0.00000 days
Start: 2024-04-26 — End: ?

## 2024-04-27 MED ORDER — INSULIN LISPRO (U-200) 200 UNIT/ML (3 ML) SUBCUTANEOUS PEN
3 refills | 0.00000 days
Start: 2024-04-27 — End: ?

## 2024-04-28 MED ORDER — DULOXETINE 60 MG CAPSULE,DELAYED RELEASE
ORAL_CAPSULE | Freq: Every day | ORAL | 3 refills | 90.00000 days | Status: CP
Start: 2024-04-28 — End: ?

## 2024-04-28 MED ORDER — INSULIN LISPRO (U-200) 200 UNIT/ML (3 ML) SUBCUTANEOUS PEN
SUBCUTANEOUS | 3 refills | 0.00000 days | Status: CP
Start: 2024-04-28 — End: ?

## 2024-05-09 ENCOUNTER — Inpatient Hospital Stay: Admit: 2024-05-09 | Discharge: 2024-05-09 | Payer: PRIVATE HEALTH INSURANCE

## 2024-05-09 DIAGNOSIS — Z6841 Body Mass Index (BMI) 40.0 and over, adult: Principal | ICD-10-CM

## 2024-05-09 DIAGNOSIS — H93A3 Pulsatile tinnitus, bilateral: Principal | ICD-10-CM

## 2024-05-09 DIAGNOSIS — H903 Sensorineural hearing loss, bilateral: Principal | ICD-10-CM

## 2024-05-09 DIAGNOSIS — K219 Gastro-esophageal reflux disease without esophagitis: Principal | ICD-10-CM

## 2024-05-09 DIAGNOSIS — Z87891 Personal history of nicotine dependence: Principal | ICD-10-CM

## 2024-05-09 DIAGNOSIS — R2689 Other abnormalities of gait and mobility: Principal | ICD-10-CM

## 2024-05-10 DIAGNOSIS — E041 Nontoxic single thyroid nodule: Principal | ICD-10-CM

## 2024-05-10 DIAGNOSIS — E049 Nontoxic goiter, unspecified: Principal | ICD-10-CM

## 2024-05-11 DIAGNOSIS — K7460 Unspecified cirrhosis of liver: Principal | ICD-10-CM

## 2024-05-12 DIAGNOSIS — E1142 Type 2 diabetes mellitus with diabetic polyneuropathy: Principal | ICD-10-CM

## 2024-05-12 DIAGNOSIS — Z794 Long term (current) use of insulin: Principal | ICD-10-CM

## 2024-05-12 MED ORDER — METFORMIN ER 500 MG TABLET,EXTENDED RELEASE 24 HR
ORAL_TABLET | ORAL | 2 refills | 0.00000 days | Status: CP
Start: 2024-05-12 — End: ?

## 2024-05-27 ENCOUNTER — Inpatient Hospital Stay: Admit: 2024-05-27 | Discharge: 2024-05-27 | Payer: PRIVATE HEALTH INSURANCE

## 2024-05-27 DIAGNOSIS — Z1231 Encounter for screening mammogram for malignant neoplasm of breast: Principal | ICD-10-CM

## 2024-05-28 DIAGNOSIS — E041 Nontoxic single thyroid nodule: Principal | ICD-10-CM

## 2024-06-01 ENCOUNTER — Inpatient Hospital Stay: Admit: 2024-06-01 | Discharge: 2024-06-01 | Payer: PRIVATE HEALTH INSURANCE

## 2024-06-02 ENCOUNTER — Encounter: Admit: 2024-06-02 | Discharge: 2024-06-02 | Payer: PRIVATE HEALTH INSURANCE

## 2024-06-02 ENCOUNTER — Encounter
Admit: 2024-06-02 | Discharge: 2024-06-02 | Payer: PRIVATE HEALTH INSURANCE | Attending: Internal Medicine | Primary: Internal Medicine

## 2024-06-02 DIAGNOSIS — E113592 Type 2 diabetes mellitus with proliferative diabetic retinopathy without macular edema, left eye: Principal | ICD-10-CM

## 2024-06-02 DIAGNOSIS — F418 Other specified anxiety disorders: Principal | ICD-10-CM

## 2024-06-02 DIAGNOSIS — Z1211 Encounter for screening for malignant neoplasm of colon: Principal | ICD-10-CM

## 2024-06-02 DIAGNOSIS — K7469 Other cirrhosis of liver: Principal | ICD-10-CM

## 2024-06-02 DIAGNOSIS — R2689 Other abnormalities of gait and mobility: Principal | ICD-10-CM

## 2024-06-02 DIAGNOSIS — E1159 Type 2 diabetes mellitus with other circulatory complications: Principal | ICD-10-CM

## 2024-06-02 DIAGNOSIS — E041 Nontoxic single thyroid nodule: Principal | ICD-10-CM

## 2024-06-02 DIAGNOSIS — E049 Nontoxic goiter, unspecified: Principal | ICD-10-CM

## 2024-06-02 DIAGNOSIS — I152 Hypertension secondary to endocrine disorders: Principal | ICD-10-CM

## 2024-06-02 DIAGNOSIS — H93A9 Pulsatile tinnitus, unspecified ear: Principal | ICD-10-CM

## 2024-06-02 DIAGNOSIS — C541 Malignant neoplasm of endometrium: Principal | ICD-10-CM

## 2024-06-02 DIAGNOSIS — H0520 Unspecified exophthalmos: Principal | ICD-10-CM

## 2024-06-02 DIAGNOSIS — Z794 Long term (current) use of insulin: Principal | ICD-10-CM

## 2024-06-03 DIAGNOSIS — H40003 Preglaucoma, unspecified, bilateral: Principal | ICD-10-CM

## 2024-06-07 ENCOUNTER — Inpatient Hospital Stay: Admit: 2024-06-07 | Discharge: 2024-06-07 | Payer: PRIVATE HEALTH INSURANCE

## 2024-06-08 DIAGNOSIS — E041 Nontoxic single thyroid nodule: Principal | ICD-10-CM

## 2024-06-11 MED ORDER — INSULIN GLARGINE (U-300) CONC. 300 UNIT/ML (3 ML) SUBCUTANEOUS PEN
SUBCUTANEOUS | 3 refills | 0.00000 days | Status: CP
Start: 2024-06-11 — End: ?

## 2024-06-29 DIAGNOSIS — H43393 Other vitreous opacities, bilateral: Principal | ICD-10-CM

## 2024-06-29 DIAGNOSIS — E113592 Type 2 diabetes mellitus with proliferative diabetic retinopathy without macular edema, left eye: Principal | ICD-10-CM

## 2024-06-29 DIAGNOSIS — H269 Unspecified cataract: Principal | ICD-10-CM

## 2024-06-29 DIAGNOSIS — H4311 Vitreous hemorrhage, right eye: Principal | ICD-10-CM

## 2024-08-25 DIAGNOSIS — I10 Essential (primary) hypertension: Principal | ICD-10-CM

## 2024-08-25 DIAGNOSIS — I152 Hypertension secondary to endocrine disorders: Principal | ICD-10-CM

## 2024-08-25 DIAGNOSIS — R7989 Other specified abnormal findings of blood chemistry: Principal | ICD-10-CM

## 2024-08-25 DIAGNOSIS — Z6841 Body Mass Index (BMI) 40.0 and over, adult: Principal | ICD-10-CM

## 2024-08-25 DIAGNOSIS — Z794 Long term (current) use of insulin: Principal | ICD-10-CM

## 2024-08-25 DIAGNOSIS — E1159 Type 2 diabetes mellitus with other circulatory complications: Principal | ICD-10-CM

## 2024-08-25 DIAGNOSIS — E1142 Type 2 diabetes mellitus with diabetic polyneuropathy: Principal | ICD-10-CM

## 2024-08-25 DIAGNOSIS — E041 Nontoxic single thyroid nodule: Principal | ICD-10-CM

## 2024-08-25 DIAGNOSIS — F418 Other specified anxiety disorders: Principal | ICD-10-CM

## 2024-08-25 DIAGNOSIS — F3342 Major depressive disorder, recurrent, in full remission: Principal | ICD-10-CM

## 2024-08-25 MED ORDER — MOUNJARO 15 MG/0.5 ML SUBCUTANEOUS PEN INJECTOR
SUBCUTANEOUS | 3 refills | 0.00000 days | Status: CP
Start: 2024-08-25 — End: ?

## 2024-08-25 MED ORDER — CHLORTHALIDONE 25 MG TABLET
ORAL_TABLET | Freq: Every morning | ORAL | 3 refills | 100.00000 days | Status: CP
Start: 2024-08-25 — End: 2025-09-29

## 2024-08-25 MED ORDER — AMLODIPINE 5 MG TABLET
ORAL_TABLET | Freq: Every day | ORAL | 3 refills | 90.00000 days | Status: CP
Start: 2024-08-25 — End: ?

## 2024-08-25 MED ORDER — SPIRONOLACTONE 50 MG TABLET
ORAL_TABLET | Freq: Every day | ORAL | 3 refills | 90.00000 days | Status: CP
Start: 2024-08-25 — End: ?

## 2024-08-25 MED ORDER — OLMESARTAN 40 MG TABLET
ORAL_TABLET | Freq: Every day | ORAL | 3 refills | 90.00000 days | Status: CP
Start: 2024-08-25 — End: 2024-08-25

## 2024-08-27 MED ORDER — EMPAGLIFLOZIN 10 MG TABLET
ORAL_TABLET | Freq: Every morning | ORAL | 3 refills | 90.00000 days | Status: CP
Start: 2024-08-27 — End: ?

## 2024-09-15 DIAGNOSIS — E113592 Type 2 diabetes mellitus with proliferative diabetic retinopathy without macular edema, left eye: Principal | ICD-10-CM

## 2024-09-29 ENCOUNTER — Encounter
Admit: 2024-09-29 | Discharge: 2024-09-29 | Payer: PRIVATE HEALTH INSURANCE | Attending: Internal Medicine | Primary: Internal Medicine

## 2024-09-29 DIAGNOSIS — E1142 Type 2 diabetes mellitus with diabetic polyneuropathy: Principal | ICD-10-CM

## 2024-09-29 DIAGNOSIS — C541 Malignant neoplasm of endometrium: Principal | ICD-10-CM

## 2024-09-29 DIAGNOSIS — Z6841 Body Mass Index (BMI) 40.0 and over, adult: Principal | ICD-10-CM

## 2024-09-29 DIAGNOSIS — Z1211 Encounter for screening for malignant neoplasm of colon: Principal | ICD-10-CM

## 2024-09-29 DIAGNOSIS — K7581 Nonalcoholic steatohepatitis (NASH): Principal | ICD-10-CM

## 2024-09-29 DIAGNOSIS — I152 Hypertension secondary to endocrine disorders: Principal | ICD-10-CM

## 2024-09-29 DIAGNOSIS — E1159 Type 2 diabetes mellitus with other circulatory complications: Principal | ICD-10-CM

## 2024-09-29 DIAGNOSIS — Z794 Long term (current) use of insulin: Principal | ICD-10-CM

## 2024-09-29 MED ORDER — MOUNJARO 15 MG/0.5 ML SUBCUTANEOUS PEN INJECTOR
SUBCUTANEOUS | 3 refills | 126.00000 days | Status: CP
Start: 2024-09-29 — End: ?
